# Patient Record
Sex: Female | Born: 1985 | Race: White | Hispanic: Yes | Marital: Single | Smoking: Current every day smoker
Health system: Southern US, Community
[De-identification: ages and names within clinical notes are randomized; demographics above are authoritative.]

## PROBLEM LIST (undated history)

## (undated) DIAGNOSIS — L0390 Cellulitis, unspecified: Secondary | ICD-10-CM

## (undated) DIAGNOSIS — E119 Type 2 diabetes mellitus without complications: Secondary | ICD-10-CM

---

## 2015-04-23 ENCOUNTER — Emergency Department (HOSPITAL_BASED_OUTPATIENT_CLINIC_OR_DEPARTMENT_OTHER): Payer: Self-pay

## 2015-04-23 ENCOUNTER — Encounter (HOSPITAL_BASED_OUTPATIENT_CLINIC_OR_DEPARTMENT_OTHER): Payer: Self-pay | Admitting: *Deleted

## 2015-04-23 ENCOUNTER — Inpatient Hospital Stay (HOSPITAL_BASED_OUTPATIENT_CLINIC_OR_DEPARTMENT_OTHER)
Admission: EM | Admit: 2015-04-23 | Discharge: 2015-04-27 | DRG: 580 | Disposition: A | Discharge status: Home / Self Care | Payer: Self-pay | Attending: Internal Medicine | Admitting: Internal Medicine

## 2015-04-23 DIAGNOSIS — W231XXA Caught, crushed, jammed, or pinched between stationary objects, initial encounter: Secondary | ICD-10-CM | POA: Diagnosis present

## 2015-04-23 DIAGNOSIS — IMO0001 Reserved for inherently not codable concepts without codable children: Secondary | ICD-10-CM | POA: Diagnosis present

## 2015-04-23 DIAGNOSIS — Z794 Long term (current) use of insulin: Secondary | ICD-10-CM

## 2015-04-23 DIAGNOSIS — Z833 Family history of diabetes mellitus: Secondary | ICD-10-CM

## 2015-04-23 DIAGNOSIS — E871 Hypo-osmolality and hyponatremia: Secondary | ICD-10-CM | POA: Diagnosis present

## 2015-04-23 DIAGNOSIS — E860 Dehydration: Secondary | ICD-10-CM | POA: Diagnosis present

## 2015-04-23 DIAGNOSIS — L02413 Cutaneous abscess of right upper limb: Secondary | ICD-10-CM | POA: Diagnosis present

## 2015-04-23 DIAGNOSIS — L03113 Cellulitis of right upper limb: Principal | ICD-10-CM | POA: Diagnosis present

## 2015-04-23 DIAGNOSIS — F17210 Nicotine dependence, cigarettes, uncomplicated: Secondary | ICD-10-CM | POA: Diagnosis present

## 2015-04-23 DIAGNOSIS — E1065 Type 1 diabetes mellitus with hyperglycemia: Secondary | ICD-10-CM | POA: Diagnosis present

## 2015-04-23 DIAGNOSIS — B3749 Other urogenital candidiasis: Secondary | ICD-10-CM | POA: Diagnosis present

## 2015-04-23 DIAGNOSIS — L0390 Cellulitis, unspecified: Secondary | ICD-10-CM | POA: Diagnosis present

## 2015-04-23 DIAGNOSIS — Z720 Tobacco use: Secondary | ICD-10-CM | POA: Diagnosis present

## 2015-04-23 HISTORY — DX: Cellulitis, unspecified: L03.90

## 2015-04-23 HISTORY — DX: Type 2 diabetes mellitus without complications: E11.9

## 2015-04-23 LAB — CBC WITH DIFFERENTIAL/PLATELET
Basophils Absolute: 0 10*3/uL (ref 0.0–0.1)
Basophils Relative: 0 %
EOS ABS: 0.1 10*3/uL (ref 0.0–0.7)
Eosinophils Relative: 1 %
HEMATOCRIT: 43.2 % (ref 36.0–46.0)
HEMOGLOBIN: 14.9 g/dL (ref 12.0–15.0)
LYMPHS ABS: 1.7 10*3/uL (ref 0.7–4.0)
LYMPHS PCT: 14 %
MCH: 31.5 pg (ref 26.0–34.0)
MCHC: 34.5 g/dL (ref 30.0–36.0)
MCV: 91.3 fL (ref 78.0–100.0)
MONOS PCT: 5 %
Monocytes Absolute: 0.6 10*3/uL (ref 0.1–1.0)
NEUTROS ABS: 9.5 10*3/uL — AB (ref 1.7–7.7)
NEUTROS PCT: 80 %
Platelets: 223 10*3/uL (ref 150–400)
RBC: 4.73 MIL/uL (ref 3.87–5.11)
RDW: 11.8 % (ref 11.5–15.5)
WBC: 11.8 10*3/uL — AB (ref 4.0–10.5)

## 2015-04-23 LAB — URINE MICROSCOPIC-ADD ON

## 2015-04-23 LAB — URINALYSIS, ROUTINE W REFLEX MICROSCOPIC
BILIRUBIN URINE: NEGATIVE
Glucose, UA: >1000 mg/dL — AB
HGB URINE DIPSTICK: NEGATIVE
Ketones, ur: NEGATIVE mg/dL
Nitrite: NEGATIVE
PH: 6 (ref 5.0–8.0)
Protein, ur: NEGATIVE mg/dL
SPECIFIC GRAVITY, URINE: 1.036 — AB (ref 1.005–1.030)

## 2015-04-23 LAB — BASIC METABOLIC PANEL
Anion gap: 10 (ref 5–15)
BUN: 11 mg/dL (ref 6–20)
CHLORIDE: 91 mmol/L — AB (ref 101–111)
CO2: 25 mmol/L (ref 22–32)
CREATININE: 0.79 mg/dL (ref 0.44–1.00)
Calcium: 9 mg/dL (ref 8.9–10.3)
GFR calc Af Amer: >60 mL/min (ref >60)
GFR calc non Af Amer: >60 mL/min (ref >60)
Glucose, Bld: 735 mg/dL (ref 65–99)
POTASSIUM: 4.7 mmol/L (ref 3.5–5.1)
SODIUM: 126 mmol/L — AB (ref 135–145)

## 2015-04-23 LAB — PREGNANCY, URINE: Preg Test, Ur: NEGATIVE

## 2015-04-23 LAB — CBG MONITORING, ED
GLUCOSE-CAPILLARY: 543 mg/dL — AB (ref 65–99)
Glucose-Capillary: 341 mg/dL — ABNORMAL HIGH (ref 65–99)
Glucose-Capillary: >600 mg/dL (ref 65–99)

## 2015-04-23 MED ORDER — INSULIN ASPART 100 UNIT/ML IV SOLN
5.0000 [IU] | Freq: Once | INTRAVENOUS | Status: AC
  Administered 2015-04-23: 5 [IU] via INTRAVENOUS
  Filled 2015-04-23: qty 1

## 2015-04-23 MED ORDER — HYDROMORPHONE HCL 1 MG/ML IJ SOLN
1.0000 mg | Freq: Once | INTRAMUSCULAR | Status: AC
  Administered 2015-04-23: 1 mg via INTRAVENOUS
  Filled 2015-04-23: qty 1

## 2015-04-23 MED ORDER — IBUPROFEN 400 MG PO TABS
600.0000 mg | ORAL_TABLET | Freq: Once | ORAL | Status: AC
Start: 2015-04-23 — End: 2015-04-23
  Administered 2015-04-23: 600 mg via ORAL
  Filled 2015-04-23: qty 1

## 2015-04-23 MED ORDER — SODIUM CHLORIDE 0.9 % IV BOLUS (SEPSIS)
1000.0000 mL | Freq: Once | INTRAVENOUS | Status: AC
  Administered 2015-04-23: 1000 mL via INTRAVENOUS

## 2015-04-23 MED ORDER — CLINDAMYCIN HCL 150 MG PO CAPS
300.0000 mg | ORAL_CAPSULE | Freq: Once | ORAL | Status: AC
  Administered 2015-04-23: 300 mg via ORAL
  Filled 2015-04-23: qty 2

## 2015-04-23 MED ORDER — METRONIDAZOLE 500 MG PO TABS
2000.0000 mg | ORAL_TABLET | Freq: Once | ORAL | Status: AC
Start: 2015-04-23 — End: 2015-04-23
  Administered 2015-04-23: 2000 mg via ORAL
  Filled 2015-04-23: qty 4

## 2015-04-23 MED ORDER — LIDOCAINE-EPINEPHRINE (PF) 2 %-1:200000 IJ SOLN
20.0000 mL | Freq: Once | INTRAMUSCULAR | Status: AC
  Administered 2015-04-23: 20 mL via INTRADERMAL
  Filled 2015-04-23: qty 20

## 2015-04-23 MED ORDER — SODIUM CHLORIDE 0.9 % IV BOLUS (SEPSIS): 2000.0000 mL | Freq: Once | INTRAVENOUS | Status: DC

## 2015-04-23 NOTE — ED Provider Notes (Signed)
CSN: [id number]     Arrival date & time 04/23/15  1942 History  By signing my name below, I, Tanda Rockers, attest that this documentation has been prepared under the direction and in the presence of Mirian Mo, MD. Electronically Signed: Tanda Rockers, ED Scribe. 04/23/2015. 8:25 PM.   Chief Complaint  Patient presents with  . Arm Pain   Patient is a 29 y.o. female presenting with arm pain. The history is provided by the patient. No language interpreter was used.  Arm Pain This is a new problem. The current episode started more than 2 days ago. The problem occurs rarely. The problem has been gradually worsening. Pertinent negatives include no chest pain, no abdominal pain, no headaches and no shortness of breath. Nothing aggravates the symptoms. Nothing relieves the symptoms. She has tried a cold compress (Ibuprofen. Bactrim.) for the symptoms. The treatment provided no relief.     HPI Comments: Toniette Devera is a 29 y.o. female who presents to the Emergency Department complaining of gradual onset, constant, right arm pain x 4 days. 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 She reports that her arm began to swell and turn red, causing her to think she has cellulitis. Pt took left over Bactrim from previous cellulitis and Ibuprofen without relief. Mom has also applied ice without relief. Pt complains of a fever last night of 101 and nausea. Denies chills, vomiting, or any other associated symptoms.    Past Medical History  Diagnosis Date  . Cellulitis   . Diabetes mellitus (HCC)    History reviewed. No pertinent past surgical history. Family History  Problem Relation Age of Onset  . Diabetes Mellitus II Father    Social History  Substance Use Topics  . Smoking status: Current Every Day Smoker    Types: Cigarettes  . Smokeless tobacco: Never Used  . Alcohol Use: No   OB History    No data available     Review of  Systems  Constitutional: Positive for fever. Negative for chills.  Respiratory: Negative for shortness of breath.   Cardiovascular: Negative for chest pain.  Gastrointestinal: Positive for nausea. Negative for vomiting and abdominal pain.  Musculoskeletal: Positive for joint swelling and arthralgias (Right arm pain).  Skin: Positive for color change. Negative for wound.  Neurological: Negative for headaches.  All other systems reviewed and are negative.   Allergies  Review of patient's allergies indicates no known allergies.  Home Medications   Prior to Admission medications   Medication Sig Start Date End Date Taking? Authorizing Provider  insulin aspart (NOVOLOG) 100 UNIT/ML injection Inject 8-10 Units into the skin 3 (three) times daily before meals. Sliding scale   Yes Historical Provider, MD  insulin glargine (LANTUS) 100 UNIT/ML injection Inject 30 Units into the skin at bedtime.   Yes Historical Provider, MD   Triage Vitals: BP 132/81 mmHg  Pulse 107  Temp(Src) 98.4 F (36.9 C) (Oral)  Resp 20  Ht  (1.575 m)  Wt 125 lb (56.7 kg)  BMI 22.86 kg/m2  SpO2 100%  LMP 04/11/2015 (Exact Date)   Physical Exam  Constitutional: She is oriented to person, place, and time. She appears well-developed and well-nourished.  HENT:  Head: Normocephalic and atraumatic.  Right Ear: External ear normal.  Left Ear: External ear normal.  Eyes: Conjunctivae and EOM are normal. Pupils are equal, round, and reactive to light.  Neck: Normal range of motion. Neck  supple.  Cardiovascular: Normal rate, regular rhythm, normal heart sounds and intact distal pulses.   Pulmonary/Chest: Effort normal and breath sounds normal.  Abdominal: Soft. Bowel sounds are normal. There is no tenderness.  Musculoskeletal: Normal range of motion.  Neurological: She is alert and oriented to person, place, and time.  Skin: Skin is warm and dry.  Cellulitis of R ventral forearm, pulse 2+ distally, limited ROM  in R wrist  Vitals reviewed.   ED Course  Procedures (including critical care time)  DIAGNOSTIC STUDIES: Oxygen Saturation is 100% on RA, normal by my interpretation.    COORDINATION OF CARE: 8:24 PM-Discussed treatment plan which includes DG R Forearm, DG R Wrist, CBC, BMP, CBG, urine pregnancy  with pt at bedside and pt agreed to plan.   Labs Review Labs Reviewed  CBC WITH DIFFERENTIAL/PLATELET - Abnormal; Notable for the following:    WBC 11.8 (*)    Neutro Abs 9.5 (*)    All other components within normal limits  BASIC METABOLIC PANEL - Abnormal; Notable for the following:    Sodium 126 (*)    Chloride 91 (*)    Glucose, Bld 735 (*)    All other components within normal limits  URINALYSIS, ROUTINE W REFLEX MICROSCOPIC (NOT AT [facility]) - Abnormal; Notable for the following:    APPearance CLOUDY (*)    Specific Gravity, Urine 1.036 (*)    Glucose, UA >1000 (*)    Leukocytes, UA MODERATE (*)    All other components within normal limits  URINE MICROSCOPIC-ADD ON - Abnormal; Notable for the following:    Squamous Epithelial / LPF 6-30 (*)    Bacteria, UA FEW (*)    All other components within normal limits  GLUCOSE, CAPILLARY - Abnormal; Notable for the following:    Glucose-Capillary 311 (*)    All other components within normal limits  SEDIMENTATION RATE - Abnormal; Notable for the following:    Sed Rate 36 (*)    All other components within normal limits  BASIC METABOLIC PANEL - Abnormal; Notable for the following:    Sodium 133 (*)    Glucose, Bld 363 (*)    Calcium 8.0 (*)    All other components within normal limits  CBC WITH DIFFERENTIAL/PLATELET - Abnormal; Notable for the following:    RBC 3.79 (*)    HCT 34.8 (*)    All other components within normal limits  GLUCOSE, CAPILLARY - Abnormal; Notable for the following:    Glucose-Capillary 244 (*)    All other components within normal limits  GLUCOSE, CAPILLARY - Abnormal; Notable for the following:     Glucose-Capillary 220 (*)    All other components within normal limits  GLUCOSE, CAPILLARY - Abnormal; Notable for the following:    Glucose-Capillary 248 (*)    All other components within normal limits  GLUCOSE, CAPILLARY - Abnormal; Notable for the following:    Glucose-Capillary 152 (*)    All other components within normal limits  GLUCOSE, CAPILLARY - Abnormal; Notable for the following:    Glucose-Capillary 121 (*)    All other components within normal limits  GLUCOSE, CAPILLARY - Abnormal; Notable for the following:    Glucose-Capillary 180 (*)    All other components within normal limits  GLUCOSE, CAPILLARY - Abnormal; Notable for the following:    Glucose-Capillary 243 (*)    All other components within normal limits  CBG MONITORING, ED - Abnormal; Notable for the following:    Glucose-Capillary >600 (*)  All other components within normal limits  CBG MONITORING, ED - Abnormal; Notable for the following:    Glucose-Capillary 543 (*)    All other components within normal limits  CBG MONITORING, ED - Abnormal; Notable for the following:    Glucose-Capillary 341 (*)    All other components within normal limits  PREGNANCY, URINE  HEMOGLOBIN A1C  CBG MONITORING, ED    Imaging Review Dg Forearm Right  04/23/2015  CLINICAL DATA:  29 year old with a crush injury to the right forearm and wrist 3 days ago, anterior pain with edema and erythema. Initial encounter. EXAM: RIGHT FOREARM - 2 VIEW COMPARISON:  None. FINDINGS: Volar soft tissue swelling. No evidence of acute fracture involving the radius or ulna. No intrinsic osseous abnormality. Visualized elbow joint intact. IMPRESSION: No osseous abnormality. Electronically Signed   By: Hulan Saas M.D.   On: 04/23/2015 21:08   Dg Wrist Complete Right  04/23/2015  CLINICAL DATA:  29 year old with a crush injury to the right forearm and wrist 3 days ago, anterior pain with edema and erythema. Initial encounter. EXAM: RIGHT  WRIST - COMPLETE 3+ VIEW COMPARISON:  None. FINDINGS: Volar and medial soft tissue swelling. No evidence of acute fracture or dislocation. Joint spaces well preserved. Well-preserved bone mineral density. No intrinsic osseous abnormalities. IMPRESSION: No osseous abnormality. Electronically Signed   By: Hulan Saas M.D.   On: 04/23/2015 21:07   I have personally reviewed and evaluated these images and lab results as part of my medical decision-making.   EKG Interpretation None        MDM   Final diagnoses:  None    29 y.o. female with pertinent PMH of DM, prior cellulitis presents with R wrist pain and rash as above.  [place] at bedside demonstrated underlying abscess extending below flexor tendon sheath with fluid along the tendon sheath itself.  Consulted Hand and medicine for admission.  Clinda given  I have reviewed all laboratory and imaging studies if ordered as above  No diagnosis found.        Mirian Mo, MD 04/24/15 [phone]

## 2015-04-23 NOTE — ED Notes (Signed)
Attempted to call report, RN unavailable, message left for call back.

## 2015-04-23 NOTE — ED Notes (Signed)
Patient reports a remote history of cutting, what appears to be track marks are also visible on patient's arms. Patient states she scraped her arm on a dumpster at the end of last week and that on Friday it became very painful and turned red. Redness to right forearm noted.

## 2015-04-23 NOTE — ED Notes (Signed)
Pt reports right arm was slammed in between a dumpster and a cabinet she was lifting on Friday- pt reports area began to swell and she thought it was cellulitis so she took some leftover Bactrim- no obvious wound noted. Wrist is swollen, red and warm to touch

## 2015-04-23 NOTE — Progress Notes (Signed)
CHART REVIEWED PATIENT BEING ADMITTED TO HOSPITALIST SERVICE PICTURES DO NOT LOOK BAD WOULD TREAT FOR CELLULITIS ICE/ELEVATE,SPLINT IV ABX WILL BE BY 11/22 IN PM TO SEE AND IF WORSENS MAY NEED FORMAL I/D KEEP NPO AFTER 7 AM PLEASE DO NOT CALL ME TONIGHT I AM AWARE OF PATIENT AND KNOW TO SEE PATIENT WHEN SHE ARRIVES AT [facility] AFTER 7 AM I CAN BE REACHED BY CELL [phone]

## 2015-04-23 NOTE — ED Notes (Signed)
IV attempted x1 unsucceesful. Attempted 2nd IV stick when patient began screaming and cursing to "take the damn thing out, I can take insulin at home, I don't want to be here, just take it out". IV catheter was removed. Blood collection supplies and labels remain at bedside. Will give patient a few minutes to calm down and will take her PO meds to her and offer a second nurse to attempt IV. Charge nurse notified.

## 2015-04-23 NOTE — Progress Notes (Signed)
[facility] Littie DeedsGentry  29 year old lady with history for diabetes, tobacco abuse, who presents with right arm pain after injury.   Pt reports right arm was slammed in between a dumpster and a cabinet when she was lifting on Friday. The area began to swell and becomes painful. She thought it was cellulitis,so she took some leftover Bactrim without significant help. No obvious wound noted per EDP. Wrist is swollen, red and warm to touch per EDP. WBC 11.8, temperature normal, tachycardia, electrolytes and renal function okay. X-ray of right wrist and right arm did not show bony fracture. Ortho, Dr. Anastasio Championrtamnn was consulted by EDP, will take her to OR in morning. Urinalysis showed yeast infection, 1 dose of Flagyl, 2 g was given. Patient's CBG was 735, which improved to 440 after treated with Novolog in ED. AG normal. No ketone in urine. I asked EDP to give pt 3L of NS. Accepted to Med-surg bed.   Lorretta HarpXilin Manolito Jurewicz, MD  [facility] Pager [phone]  If 7PM-7AM, please contact night-coverage [email or web address] Password [email or web address] 04/23/2015, 11:29 PM

## 2015-04-23 NOTE — ED Notes (Signed)
When I went in patient's room to obtain CBG, patient was sipping on the family members white chocolate starbucks double shot drink. Patient's family states it was just a few sips not much.

## 2015-04-23 NOTE — ED Notes (Signed)
Patient returned from XR. 

## 2015-04-24 ENCOUNTER — Encounter (HOSPITAL_COMMUNITY): Payer: Self-pay

## 2015-04-24 ENCOUNTER — Encounter (HOSPITAL_COMMUNITY)
Admission: EM | Disposition: A | Discharge status: Home / Self Care | Payer: Self-pay | Source: Home / Self Care | Attending: Internal Medicine

## 2015-04-24 ENCOUNTER — Inpatient Hospital Stay (HOSPITAL_COMMUNITY): Payer: Self-pay | Admitting: Anesthesiology

## 2015-04-24 ENCOUNTER — Inpatient Hospital Stay (HOSPITAL_COMMUNITY): Payer: Medicaid - Out of State | Admitting: Anesthesiology

## 2015-04-24 DIAGNOSIS — L03113 Cellulitis of right upper limb: Principal | ICD-10-CM

## 2015-04-24 DIAGNOSIS — E109 Type 1 diabetes mellitus without complications: Secondary | ICD-10-CM

## 2015-04-24 DIAGNOSIS — Z720 Tobacco use: Secondary | ICD-10-CM | POA: Diagnosis present

## 2015-04-24 DIAGNOSIS — IMO0001 Reserved for inherently not codable concepts without codable children: Secondary | ICD-10-CM | POA: Diagnosis present

## 2015-04-24 DIAGNOSIS — E1065 Type 1 diabetes mellitus with hyperglycemia: Secondary | ICD-10-CM

## 2015-04-24 HISTORY — PX: I & D EXTREMITY: SHX5045

## 2015-04-24 LAB — CBC WITH DIFFERENTIAL/PLATELET
BASOS ABS: 0 10*3/uL (ref 0.0–0.1)
BASOS PCT: 0 %
EOS ABS: 0.2 10*3/uL (ref 0.0–0.7)
EOS PCT: 2 %
HCT: 34.8 % — ABNORMAL LOW (ref 36.0–46.0)
Hemoglobin: 12.1 g/dL (ref 12.0–15.0)
Lymphocytes Relative: 26 %
Lymphs Abs: 2.4 10*3/uL (ref 0.7–4.0)
MCH: 31.9 pg (ref 26.0–34.0)
MCHC: 34.8 g/dL (ref 30.0–36.0)
MCV: 91.8 fL (ref 78.0–100.0)
MONO ABS: 0.4 10*3/uL (ref 0.1–1.0)
Monocytes Relative: 4 %
Neutro Abs: 6.3 10*3/uL (ref 1.7–7.7)
Neutrophils Relative %: 68 %
PLATELETS: 182 10*3/uL (ref 150–400)
RBC: 3.79 MIL/uL — ABNORMAL LOW (ref 3.87–5.11)
RDW: 12.3 % (ref 11.5–15.5)
WBC: 9.3 10*3/uL (ref 4.0–10.5)

## 2015-04-24 LAB — SURGICAL PCR SCREEN
MRSA, PCR: NEGATIVE
STAPHYLOCOCCUS AUREUS: NEGATIVE

## 2015-04-24 LAB — BASIC METABOLIC PANEL
ANION GAP: 6 (ref 5–15)
BUN: 7 mg/dL (ref 6–20)
CALCIUM: 8 mg/dL — AB (ref 8.9–10.3)
CO2: 24 mmol/L (ref 22–32)
Chloride: 103 mmol/L (ref 101–111)
Creatinine, Ser: 0.6 mg/dL (ref 0.44–1.00)
GFR calc Af Amer: >60 mL/min (ref >60)
GLUCOSE: 363 mg/dL — AB (ref 65–99)
Potassium: 4 mmol/L (ref 3.5–5.1)
Sodium: 133 mmol/L — ABNORMAL LOW (ref 135–145)

## 2015-04-24 LAB — SEDIMENTATION RATE: SED RATE: 36 mm/h — AB (ref 0–22)

## 2015-04-24 LAB — GLUCOSE, CAPILLARY
GLUCOSE-CAPILLARY: 311 mg/dL — AB (ref 65–99)
GLUCOSE-CAPILLARY: 244 mg/dL — AB (ref 65–99)
GLUCOSE-CAPILLARY: 121 mg/dL — AB (ref 65–99)
GLUCOSE-CAPILLARY: 180 mg/dL — AB (ref 65–99)
GLUCOSE-CAPILLARY: 273 mg/dL — AB (ref 65–99)
GLUCOSE-CAPILLARY: 95 mg/dL (ref 65–99)
GLUCOSE-CAPILLARY: 65 mg/dL (ref 65–99)
Glucose-Capillary: 220 mg/dL — ABNORMAL HIGH (ref 65–99)
Glucose-Capillary: 248 mg/dL — ABNORMAL HIGH (ref 65–99)
Glucose-Capillary: 152 mg/dL — ABNORMAL HIGH (ref 65–99)
Glucose-Capillary: 243 mg/dL — ABNORMAL HIGH (ref 65–99)
Glucose-Capillary: 117 mg/dL — ABNORMAL HIGH (ref 65–99)
Glucose-Capillary: 102 mg/dL — ABNORMAL HIGH (ref 65–99)
Glucose-Capillary: 106 mg/dL — ABNORMAL HIGH (ref 65–99)
Glucose-Capillary: 102 mg/dL — ABNORMAL HIGH (ref 65–99)

## 2015-04-24 SURGERY — IRRIGATION AND DEBRIDEMENT EXTREMITY
Anesthesia: General | Site: Arm Lower | Laterality: Right

## 2015-04-24 MED ORDER — HYDROCODONE-ACETAMINOPHEN 7.5-325 MG/15ML PO SOLN
10.0000 mL | Freq: Four times a day (QID) | ORAL | Status: DC | PRN
  Administered 2015-04-24 – 2015-04-27 (×3): 10 mL via ORAL
  Filled 2015-04-24 (×3): qty 15

## 2015-04-24 MED ORDER — ENSURE ENLIVE PO LIQD: 237.0000 mL | Freq: Two times a day (BID) | ORAL | Status: DC

## 2015-04-24 MED ORDER — SODIUM CHLORIDE 0.9 % IV SOLN
INTRAVENOUS | Status: DC
  Administered 2015-04-24: 5.6 [IU]/h via INTRAVENOUS
  Administered 2015-04-24: 2.8 [IU]/h via INTRAVENOUS
  Administered 2015-04-24: 1.8 [IU]/h via INTRAVENOUS
  Filled 2015-04-24: qty 2.5

## 2015-04-24 MED ORDER — INSULIN ASPART 100 UNIT/ML ~~LOC~~ SOLN
0.0000 [IU] | Freq: Every day | SUBCUTANEOUS | Status: DC
  Administered 2015-04-25: 4 [IU] via SUBCUTANEOUS

## 2015-04-24 MED ORDER — DEXTROSE 50 % IV SOLN
25.0000 mL | INTRAVENOUS | Status: DC | PRN
  Administered 2015-04-24: 14 mL via INTRAVENOUS

## 2015-04-24 MED ORDER — SODIUM CHLORIDE 0.9 % IV SOLN
INTRAVENOUS | Status: AC
  Administered 2015-04-24 (×2): via INTRAVENOUS

## 2015-04-24 MED ORDER — ONDANSETRON HCL 4 MG/2ML IJ SOLN
4.0000 mg | Freq: Four times a day (QID) | INTRAMUSCULAR | Status: DC | PRN
  Administered 2015-04-24: 4 mg via INTRAVENOUS
  Filled 2015-04-24: qty 2

## 2015-04-24 MED ORDER — PIPERACILLIN-TAZOBACTAM 3.375 G IVPB
3.3750 g | Freq: Three times a day (TID) | INTRAVENOUS | Status: DC
  Administered 2015-04-24 – 2015-04-26 (×6): 3.375 g via INTRAVENOUS
  Filled 2015-04-24 (×8): qty 50

## 2015-04-24 MED ORDER — ONDANSETRON HCL 4 MG PO TABS: 4.0000 mg | ORAL_TABLET | Freq: Four times a day (QID) | ORAL | Status: DC | PRN

## 2015-04-24 MED ORDER — HYDROMORPHONE HCL 1 MG/ML IJ SOLN
0.2500 mg | INTRAMUSCULAR | Status: DC | PRN
  Administered 2015-04-24 (×3): 0.5 mg via INTRAVENOUS

## 2015-04-24 MED ORDER — SODIUM CHLORIDE 0.9 % IR SOLN
Status: DC | PRN
  Administered 2015-04-24: 1000 mL

## 2015-04-24 MED ORDER — INSULIN REGULAR BOLUS VIA INFUSION
0.0000 [IU] | Freq: Three times a day (TID) | INTRAVENOUS | Status: DC
  Administered 2015-04-24: 1.2 [IU] via INTRAVENOUS
  Filled 2015-04-24: qty 10

## 2015-04-24 MED ORDER — HYDROMORPHONE HCL 1 MG/ML IJ SOLN
1.0000 mg | INTRAMUSCULAR | Status: AC | PRN
  Administered 2015-04-24 (×2): 1 mg via INTRAVENOUS
  Filled 2015-04-24 (×2): qty 1

## 2015-04-24 MED ORDER — OXYCODONE HCL 5 MG PO TABS: 5.0000 mg | ORAL_TABLET | ORAL | Status: DC | PRN

## 2015-04-24 MED ORDER — HEPARIN SODIUM (PORCINE) 5000 UNIT/ML IJ SOLN
5000.0000 [IU] | Freq: Three times a day (TID) | INTRAMUSCULAR | Status: DC
  Administered 2015-04-24 – 2015-04-27 (×6): 5000 [IU] via SUBCUTANEOUS
  Filled 2015-04-24 (×7): qty 1

## 2015-04-24 MED ORDER — INSULIN GLARGINE 100 UNIT/ML ~~LOC~~ SOLN
30.0000 [IU] | Freq: Every day | SUBCUTANEOUS | Status: DC
  Administered 2015-04-24: 30 [IU] via SUBCUTANEOUS
  Filled 2015-04-24: qty 0.3

## 2015-04-24 MED ORDER — INSULIN ASPART 100 UNIT/ML ~~LOC~~ SOLN
0.0000 [IU] | Freq: Three times a day (TID) | SUBCUTANEOUS | Status: DC
  Administered 2015-04-25: 15 [IU] via SUBCUTANEOUS
  Administered 2015-04-25: 11 [IU] via SUBCUTANEOUS
  Administered 2015-04-25: 8 [IU] via SUBCUTANEOUS
  Administered 2015-04-26: 15 [IU] via SUBCUTANEOUS
  Administered 2015-04-26: 8 [IU] via SUBCUTANEOUS
  Administered 2015-04-26: 15 [IU] via SUBCUTANEOUS
  Administered 2015-04-27: 5 [IU] via SUBCUTANEOUS
  Administered 2015-04-27: 15 [IU] via SUBCUTANEOUS

## 2015-04-24 MED ORDER — ACETAMINOPHEN 650 MG RE SUPP: 650.0000 mg | Freq: Four times a day (QID) | RECTAL | Status: DC | PRN

## 2015-04-24 MED ORDER — MIDAZOLAM HCL 2 MG/2ML IJ SOLN
INTRAMUSCULAR | Status: AC
  Filled 2015-04-24: qty 2

## 2015-04-24 MED ORDER — LIDOCAINE HCL (CARDIAC) 20 MG/ML IV SOLN
INTRAVENOUS | Status: DC | PRN
  Administered 2015-04-24: 60 mg via INTRAVENOUS

## 2015-04-24 MED ORDER — SODIUM CHLORIDE 0.9 % IV SOLN: INTRAVENOUS | Status: DC

## 2015-04-24 MED ORDER — MIDAZOLAM HCL 5 MG/5ML IJ SOLN
INTRAMUSCULAR | Status: DC | PRN
  Administered 2015-04-24 (×2): 2 mg via INTRAVENOUS

## 2015-04-24 MED ORDER — PROPOFOL 10 MG/ML IV BOLUS
INTRAVENOUS | Status: DC | PRN
  Administered 2015-04-24: 200 mg via INTRAVENOUS

## 2015-04-24 MED ORDER — INSULIN ASPART 100 UNIT/ML ~~LOC~~ SOLN: 0.0000 [IU] | Freq: Three times a day (TID) | SUBCUTANEOUS | Status: DC

## 2015-04-24 MED ORDER — HYDROMORPHONE HCL 1 MG/ML IJ SOLN
INTRAMUSCULAR | Status: AC
  Administered 2015-04-24: 0.5 mg via INTRAVENOUS
  Filled 2015-04-24: qty 1

## 2015-04-24 MED ORDER — HYDROMORPHONE HCL 1 MG/ML IJ SOLN
INTRAMUSCULAR | Status: AC
  Filled 2015-04-24: qty 1

## 2015-04-24 MED ORDER — SODIUM CHLORIDE 0.9 % IV SOLN
INTRAVENOUS | Status: DC
  Administered 2015-04-24: 03:00:00 via INTRAVENOUS

## 2015-04-24 MED ORDER — PROMETHAZINE HCL 25 MG/ML IJ SOLN
INTRAMUSCULAR | Status: AC
  Filled 2015-04-24: qty 1

## 2015-04-24 MED ORDER — PROPOFOL 10 MG/ML IV BOLUS
INTRAVENOUS | Status: AC
  Filled 2015-04-24: qty 20

## 2015-04-24 MED ORDER — PROMETHAZINE HCL 25 MG/ML IJ SOLN: 6.2500 mg | INTRAMUSCULAR | Status: DC | PRN

## 2015-04-24 MED ORDER — MORPHINE SULFATE (PF) 2 MG/ML IV SOLN
2.0000 mg | INTRAVENOUS | Status: DC | PRN
  Administered 2015-04-24 (×2): 2 mg via INTRAVENOUS
  Administered 2015-04-25: 1 mg via INTRAVENOUS
  Administered 2015-04-25: 2 mg via INTRAVENOUS
  Filled 2015-04-24 (×5): qty 1

## 2015-04-24 MED ORDER — VANCOMYCIN HCL IN DEXTROSE 750-5 MG/150ML-% IV SOLN
750.0000 mg | Freq: Two times a day (BID) | INTRAVENOUS | Status: DC
  Administered 2015-04-24 – 2015-04-26 (×5): 750 mg via INTRAVENOUS
  Filled 2015-04-24 (×7): qty 150

## 2015-04-24 MED ORDER — PIPERACILLIN-TAZOBACTAM 3.375 G IVPB 30 MIN
3.3750 g | Freq: Once | INTRAVENOUS | Status: AC
  Administered 2015-04-24: 3.375 g via INTRAVENOUS
  Filled 2015-04-24: qty 50

## 2015-04-24 MED ORDER — ONDANSETRON HCL 4 MG/2ML IJ SOLN
INTRAMUSCULAR | Status: DC | PRN
  Administered 2015-04-24: 4 mg via INTRAVENOUS

## 2015-04-24 MED ORDER — FENTANYL CITRATE (PF) 250 MCG/5ML IJ SOLN
INTRAMUSCULAR | Status: AC
  Filled 2015-04-24: qty 5

## 2015-04-24 MED ORDER — DEXTROSE 50 % IV SOLN
INTRAVENOUS | Status: AC
  Administered 2015-04-24: 14 mL via INTRAVENOUS
  Filled 2015-04-24: qty 50

## 2015-04-24 MED ORDER — FENTANYL CITRATE (PF) 100 MCG/2ML IJ SOLN
INTRAMUSCULAR | Status: DC | PRN
  Administered 2015-04-24: 100 ug via INTRAVENOUS
  Administered 2015-04-24: 50 ug via INTRAVENOUS
  Administered 2015-04-24: 100 ug via INTRAVENOUS

## 2015-04-24 MED ORDER — ACETAMINOPHEN 325 MG PO TABS: 650.0000 mg | ORAL_TABLET | Freq: Four times a day (QID) | ORAL | Status: DC | PRN

## 2015-04-24 MED ORDER — BUPIVACAINE HCL (PF) 0.25 % IJ SOLN
INTRAMUSCULAR | Status: AC
  Filled 2015-04-24: qty 30

## 2015-04-24 SURGICAL SUPPLY — 55 items
BANDAGE ELASTIC 3 VELCRO ST LF (GAUZE/BANDAGES/DRESSINGS) ×3 IMPLANT
BANDAGE ELASTIC 4 VELCRO ST LF (GAUZE/BANDAGES/DRESSINGS) ×3 IMPLANT
BNDG COHESIVE 1X5 TAN STRL LF (GAUZE/BANDAGES/DRESSINGS) IMPLANT
BNDG CONFORM 2 STRL LF (GAUZE/BANDAGES/DRESSINGS) IMPLANT
BNDG ESMARK 4X9 LF (GAUZE/BANDAGES/DRESSINGS) ×3 IMPLANT
BNDG GAUZE ELAST 4 BULKY (GAUZE/BANDAGES/DRESSINGS) ×3 IMPLANT
CORDS BIPOLAR (ELECTRODE) ×3 IMPLANT
COVER SURGICAL LIGHT HANDLE (MISCELLANEOUS) ×3 IMPLANT
CUFF TOURNIQUET SINGLE 18IN (TOURNIQUET CUFF) ×3 IMPLANT
CUFF TOURNIQUET SINGLE 24IN (TOURNIQUET CUFF) IMPLANT
DRAIN PENROSE 1/4X12 LTX STRL (WOUND CARE) IMPLANT
DRAPE SURG 17X23 STRL (DRAPES) ×3 IMPLANT
DRSG ADAPTIC 3X8 NADH LF (GAUZE/BANDAGES/DRESSINGS) ×3 IMPLANT
ELECT REM PT RETURN 9FT ADLT (ELECTROSURGICAL) ×3
ELECTRODE REM PT RTRN 9FT ADLT (ELECTROSURGICAL) ×1 IMPLANT
GAUZE IODOFORM PACK 1/2 7832 (GAUZE/BANDAGES/DRESSINGS) ×3 IMPLANT
GAUZE SPONGE 4X4 12PLY STRL (GAUZE/BANDAGES/DRESSINGS) ×3 IMPLANT
GAUZE XEROFORM 1X8 LF (GAUZE/BANDAGES/DRESSINGS) ×3 IMPLANT
GAUZE XEROFORM 5X9 LF (GAUZE/BANDAGES/DRESSINGS) IMPLANT
GLOVE BIOGEL PI IND STRL 8.5 (GLOVE) ×1 IMPLANT
GLOVE BIOGEL PI INDICATOR 8.5 (GLOVE) ×2
GLOVE SURG ORTHO 8.0 STRL STRW (GLOVE) ×3 IMPLANT
GOWN STRL REUS W/ TWL LRG LVL3 (GOWN DISPOSABLE) ×1 IMPLANT
GOWN STRL REUS W/ TWL XL LVL3 (GOWN DISPOSABLE) ×2 IMPLANT
GOWN STRL REUS W/TWL LRG LVL3 (GOWN DISPOSABLE) ×2
GOWN STRL REUS W/TWL XL LVL3 (GOWN DISPOSABLE) ×4
HANDPIECE INTERPULSE COAX TIP (DISPOSABLE)
KIT BASIN OR (CUSTOM PROCEDURE TRAY) ×3 IMPLANT
KIT ROOM TURNOVER OR (KITS) ×3 IMPLANT
MANIFOLD NEPTUNE II (INSTRUMENTS) ×3 IMPLANT
NEEDLE HYPO 25GX1X1/2 BEV (NEEDLE) ×3 IMPLANT
NS IRRIG 1000ML POUR BTL (IV SOLUTION) ×3 IMPLANT
PACK ORTHO EXTREMITY (CUSTOM PROCEDURE TRAY) ×3 IMPLANT
PAD ARMBOARD 7.5X6 YLW CONV (MISCELLANEOUS) ×6 IMPLANT
PAD CAST 4YDX4 CTTN HI CHSV (CAST SUPPLIES) ×1 IMPLANT
PADDING CAST COTTON 4X4 STRL (CAST SUPPLIES) ×2
SET HNDPC FAN SPRY TIP SCT (DISPOSABLE) IMPLANT
SOAP 2 % CHG 4 OZ (WOUND CARE) ×3 IMPLANT
SPLINT FIBERGLASS 3X12 (CAST SUPPLIES) ×3 IMPLANT
SPONGE LAP 18X18 X RAY DECT (DISPOSABLE) ×3 IMPLANT
SPONGE LAP 4X18 X RAY DECT (DISPOSABLE) ×3 IMPLANT
SUCTION FRAZIER TIP 10 FR DISP (SUCTIONS) ×3 IMPLANT
SUT ETHILON 4 0 PS 2 18 (SUTURE) IMPLANT
SUT ETHILON 5 0 P 3 18 (SUTURE)
SUT NYLON ETHILON 5-0 P-3 1X18 (SUTURE) IMPLANT
SUT PROLENE 3 0 PS 2 (SUTURE) ×3 IMPLANT
SYR CONTROL 10ML LL (SYRINGE) IMPLANT
TOWEL OR 17X24 6PK STRL BLUE (TOWEL DISPOSABLE) ×3 IMPLANT
TOWEL OR 17X26 10 PK STRL BLUE (TOWEL DISPOSABLE) ×3 IMPLANT
TUBE ANAEROBIC SPECIMEN COL (MISCELLANEOUS) IMPLANT
TUBE CONNECTING 12'X1/4 (SUCTIONS) ×1
TUBE CONNECTING 12X1/4 (SUCTIONS) ×2 IMPLANT
UNDERPAD 30X30 INCONTINENT (UNDERPADS AND DIAPERS) ×3 IMPLANT
WATER STERILE IRR 1000ML POUR (IV SOLUTION) ×3 IMPLANT
YANKAUER SUCT BULB TIP NO VENT (SUCTIONS) ×3 IMPLANT

## 2015-04-24 NOTE — Progress Notes (Signed)
Patient received pamphlet from [facility]. CM explained to patient that they may use the on site pharmacy to fill prescriptions given to them at discharge. Patient aware that the [facility] will not fill narcotics or pain medications prior to the patient being seen by one of their physicians.  Patient aware that they must be seen as a patient prior to the pharmacy filling the prescriptions a second time.  

## 2015-04-24 NOTE — ED Notes (Signed)
Receiving nurse, Caelin, notified patient is en route with [facility] at this time.

## 2015-04-24 NOTE — ED Notes (Signed)
Patient given diet ginger ale.

## 2015-04-24 NOTE — OR Nursing (Signed)
Upon arrival to PACU, cbg 65. Orders received from glucose stabilizer for 14 mls D50.  D50 given and insulin gtt stopped per stabilizer.  Dr. Krista BlueSinger informed of events.  Follow up CBG 106.

## 2015-04-24 NOTE — Progress Notes (Signed)
Patient Demographics:    Barbara Webb, is a 29 y.o. female, DOB - August 26, 1985, ZOX:[id number]  Admit date - 04/23/2015   Admitting Physician Lorretta Harp, MD  Outpatient Primary MD for the patient is No primary care provider on file.  LOS - 1   Chief Complaint  Patient presents with  . Arm Pain        Subjective:    Barbara Webb today has, No headache, No chest pain, No abdominal pain - No Nausea, No new weakness tingling or numbness, No Cough - SOB. Is having constant dull nonradiating right arm pain   Assessment  & Plan :     1. Right arm pain and swelling. After traumatic injury, no obvious laceration or signs of abscess formation, could have some muscle injury along with cellulitis. Continue empiric antibiotics. Hand surgery on board will defer management of this problem to hand surgery. She confirms she took tetanus shot 2 years ago.  2. DM type I. Poorly controlled. Change to glucose stabilizer for now.   3. Dehydration. IV fluids for hydration.   4. Smoking. Counseled to quit.   A does not pregnant.    Code Status : Full  Family Communication  : None present  Disposition Plan  : Remain inpatient for the next 2-3 days  Consults  : Hand surgery  Procedures  :   DVT Prophylaxis  :    Heparin   Lab Results  Component Value Date   PLT 182 04/24/2015    Inpatient Medications  Scheduled Meds: . feeding supplement (ENSURE ENLIVE)  237 mL Oral BID BM  . insulin regular  0-10 Units Intravenous TID WC  . piperacillin-tazobactam (ZOSYN)  IV  3.375 g Intravenous Q8H  . vancomycin  750 mg Intravenous Q12H   Continuous Infusions: . sodium chloride 100 mL/hr at 04/24/15 0750  . insulin (NOVOLIN-R) infusion 1.8 Units/hr (04/24/15 0754)   PRN Meds:.acetaminophen **OR**  acetaminophen, dextrose, ondansetron **OR** ondansetron (ZOFRAN) IV  Antibiotics  :     Anti-infectives    Start     Dose/Rate Route Frequency Ordered Stop   04/24/15 0800  piperacillin-tazobactam (ZOSYN) IVPB 3.375 g     3.375 g 12.5 mL/hr over 240 Minutes Intravenous Every 8 hours 04/24/15 0315     04/24/15 0400  vancomycin (VANCOCIN) IVPB 750 mg/150 ml premix     750 mg 150 mL/hr over 60 Minutes Intravenous Every 12 hours 04/24/15 0315     04/24/15 0330  piperacillin-tazobactam (ZOSYN) IVPB 3.375 g     3.375 g 100 mL/hr over 30 Minutes Intravenous  Once 04/24/15 0315 04/24/15 0413   04/23/15 2130  metroNIDAZOLE (FLAGYL) tablet 2,000 mg     2,000 mg Oral  Once 04/23/15 2129 04/23/15 2143   04/23/15 2030  clindamycin (CLEOCIN) capsule 300 mg     300 mg Oral  Once 04/23/15 2017 04/23/15 2110        Objective:   Filed Vitals:   04/23/15 2313 04/24/15 0022 04/24/15 0127 04/24/15 0527  BP: 111/78 109/70 103/59 96/62  Pulse: 98 100 87 89  Temp:   97.6 F (36.4 C) 97.5 F (36.4 C)  TempSrc:   Oral Oral  Resp: Height:   5'  2.4" (1.585 m)   Weight:   59.194 kg (130 lb 8 oz)   SpO2: 95% 98% 98% 95%    Wt Readings from Last 3 Encounters:  04/24/15 59.194 kg (130 lb 8 oz)     Intake/Output Summary (Last 24 hours) at 04/24/15 [id number] Last data filed at 04/24/15 0558  Gross per 24 hour  Intake 708.75 ml  Output    650 ml  Net  58.75 ml     Physical Exam  Awake Alert, Oriented X 3, No new F.N deficits, Normal affect [place].AT,PERRAL Supple Neck,No JVD, No cervical lymphadenopathy appriciated.  Symmetrical Chest wall movement, Good air movement bilaterally, CTAB RRR,No Gallops,Rubs or new Murmurs, No Parasternal Heave +ve B.Sounds, Abd Soft, No tenderness, No organomegaly appriciated, No rebound - guarding or rigidity. No Cyanosis, Clubbing or edema, No new Rash or bruise  Right arm and wrist are mildly swollen with mild erythema, tenderness to palpate. No obvious  fluctuation. Good radial pulse.    Data Review:   Micro Results No results found for this or any previous visit (from the past 240 hour(s)).  Radiology Reports Dg Forearm Right  04/23/2015  CLINICAL DATA:  29 year old with a crush injury to the right forearm and wrist 3 days ago, anterior pain with edema and erythema. Initial encounter. EXAM: RIGHT FOREARM - 2 VIEW COMPARISON:  None. FINDINGS: Volar soft tissue swelling. No evidence of acute fracture involving the radius or ulna. No intrinsic osseous abnormality. Visualized elbow joint intact. IMPRESSION: No osseous abnormality. Electronically Signed   By: Hulan Saashomas  Lawrence M.D.   On: 04/23/2015 21:08   Dg Wrist Complete Right  04/23/2015  CLINICAL DATA:  29 year old with a crush injury to the right forearm and wrist 3 days ago, anterior pain with edema and erythema. Initial encounter. EXAM: RIGHT WRIST - COMPLETE 3+ VIEW COMPARISON:  None. FINDINGS: Volar and medial soft tissue swelling. No evidence of acute fracture or dislocation. Joint spaces well preserved. Well-preserved bone mineral density. No intrinsic osseous abnormalities. IMPRESSION: No osseous abnormality. Electronically Signed   By: Hulan Saashomas  Lawrence M.D.   On: 04/23/2015 21:07     CBC  Recent Labs Lab 04/23/15 2100 04/24/15 0341  WBC 11.8* 9.3  HGB 14.9 12.1  HCT 43.2 34.8*  PLT 223 182  MCV 91.3 91.8  MCH 31.5 31.9  MCHC 34.5 34.8  RDW 11.8 12.3  LYMPHSABS 1.7 2.4  MONOABS 0.6 0.4  EOSABS 0.1 0.2  BASOSABS 0.0 0.0    Chemistries   Recent Labs Lab 04/23/15 2100 04/24/15 0341  NA 126* 133*  K 4.7 4.0  CL 91* 103  CO2 25 24  GLUCOSE 735* 363*  BUN 11 7  CREATININE 0.79 0.60  CALCIUM 9.0 8.0*   ------------------------------------------------------------------------------------------------------------------ estimated creatinine clearance is 85 mL/min (by C-G formula based on Cr of  0.6). ------------------------------------------------------------------------------------------------------------------ No results for input(s): HGBA1C in the last 72 hours. ------------------------------------------------------------------------------------------------------------------ No results for input(s): CHOL, HDL, LDLCALC, TRIG, CHOLHDL, LDLDIRECT in the last 72 hours. ------------------------------------------------------------------------------------------------------------------ No results for input(s): TSH, T4TOTAL, T3FREE, THYROIDAB in the last 72 hours.  Invalid input(s): FREET3 ------------------------------------------------------------------------------------------------------------------ No results for input(s): VITAMINB12, FOLATE, FERRITIN, TIBC, IRON, RETICCTPCT in the last 72 hours.  Coagulation profile No results for input(s): INR, PROTIME in the last 168 hours.  No results for input(s): DDIMER in the last 72 hours.  Cardiac Enzymes No results for input(s): CKMB, TROPONINI, MYOGLOBIN in the last 168 hours.  Invalid input(s): CK ------------------------------------------------------------------------------------------------------------------ Invalid input(s): POCBNP   Time Spent in  minutes  35   Susa Raring K M.D on 04/24/2015 at 9:22 AM  Between 7am to 7pm - Pager - [phone]  After 7pm go to [email or web address] - password [facility]  [facility] -  Office  [phone]

## 2015-04-24 NOTE — Progress Notes (Signed)
PLAN CONTINUE IV ABX AWAIT CULTURES I WILL CHANGE DRESSING ON 11/24 CONTINUE WITH INPATIENT CARE POSSIBLE D/C ON 11/24 IF WOUND LOOKS BETTER

## 2015-04-24 NOTE — ED Notes (Signed)
Friend who was previously riding with patient for transport has decided to go home. Provided room number and unit phone number.

## 2015-04-24 NOTE — Transfer of Care (Signed)
Immediate Anesthesia Transfer of Care Note  Patient: Barbara Webb  Procedure(s) Performed: Procedure(s): INCISION AND DRAINAGE RIGHT FOREARM (Right)  Patient Location: PACU  Anesthesia Type:General  Level of Consciousness: sedated and responds to stimulation  Airway & Oxygen Therapy: Patient Spontanous Breathing  Post-op Assessment: Report given to RN, Post -op Vital signs reviewed and stable, Patient moving all extremities and Patient moving all extremities X 4  Post vital signs: Reviewed and stable  Last Vitals:  Filed Vitals:   04/24/15 1636 04/24/15 2130  BP: 117/100 137/85  Pulse: 101 103  Temp: 37.1 C 36.9 C  Resp: 18 20    Complications: No apparent anesthesia complications

## 2015-04-24 NOTE — ED Notes (Signed)
[facility] arrived, report given

## 2015-04-24 NOTE — Care Management Note (Addendum)
Case Management Note  Patient Details  Name: Bernadene PersonDesiree Fife MRN: [id number] Date of Birth: Feb 07, 1986  Subjective/Objective:                  Date-04-24-15 Initial Assessment Spoke with patient at the bedside. Introduced self as Sports coachcase manager and explained role in discharge planning and how to be reached.  Verified patient lives at [address], [place] [address] with Ewing SchleinAunt Stephanie Lewis [phone]. Patient movd from [place] 10 days ago, she states that she plans on staying in [place] until at least the first of the yr. Verified patient anticipates to go home with family, at time of discharge.  Patient has no DME.  Expressed potential need for no other DME.  Patient confirmed  needing help with their medication. Patient has [place], she states that she is on Lantus and Novolog pens, and would prefer pens at discharge, but would accept vials as well. Patient is driven by Celine Ahrunt (relied on public transportation while living in [place]) to MD appointments.  Verified patient has no PCP. [facility] Patient provided brochure and maps.  Independent patient admitted from home with cellulitis/ abcess to underside of right wrist area. Patient has a history of seizures and diabetes. Patient currently on insulin gtt., surgery consulted.  Plan: CM will continue to follow for discharge planning and [facility].   Lawerance Sabalebbie Damiean Lukes RN BSN CM [phone]   Action/Plan:   Expected Discharge Date:                  Expected Discharge Plan:  Home/Self Care  In-House Referral:     Discharge planning Services  CM Consult, [facility]  Post Acute Care Choice:    Choice offered to:     DME Arranged:    DME Agency:     HH Arranged:    HH Agency:     Status of Service:  In process, will continue to follow  Medicare Important Message Given:    Date Medicare IM Given:    Medicare IM give by:    Date Additional Medicare IM Given:     Additional Medicare Important Message give by:     If discussed at Long Length of Stay Meetings, dates discussed:    Additional Comments:  Lawerance SabalDebbie Herminio Kniskern, RN 04/24/2015, 11:39 AM

## 2015-04-24 NOTE — Anesthesia Procedure Notes (Signed)
Procedure Name: LMA Insertion Date/Time: 04/24/2015 8:45 PM Performed by: Wray KearnsFOLEY, Chenita Ruda A Pre-anesthesia Checklist: Patient identified, Emergency Drugs available, Suction available, Patient being monitored and Timeout performed Patient Re-evaluated:Patient Re-evaluated prior to inductionOxygen Delivery Method: Circle system utilized Preoxygenation: Pre-oxygenation with 100% oxygen Intubation Type: IV induction Ventilation: Mask ventilation without difficulty LMA: LMA inserted LMA Size: 4.0 Tube type: Oral Number of attempts: 1 Placement Confirmation: positive ETCO2 and breath sounds checked- equal and bilateral Tube secured with: Tape Dental Injury: Teeth and Oropharynx as per pre-operative assessment

## 2015-04-24 NOTE — Op Note (Signed)
NAME:  Barbara Webb, Barbara Webb NO.:  [id number]  MEDICAL RECORD NO.:  [id number]  LOCATION:  [facility]                         FACILITY:  [facility]  PHYSICIAN:  Sharma Covert IV, M.D.DATE OF BIRTH:  December 06, 1985  DATE OF PROCEDURE:  04/24/2015 DATE OF DISCHARGE:                              OPERATIVE REPORT   PREOPERATIVE DIAGNOSIS:  Right forearm deep abscess.  POSTOPERATIVE DIAGNOSIS:  Right forearm deep abscess.  ATTENDING PHYSICIAN:  Sharma Covert, M.D., who was present and scrubbed for the entire procedure.  ASSISTANT:  None.  ANESTHESIA:  General via LMA.  SURGICAL PROCEDURE:  Decompression of right forearm deep abscess, distal third of the forearm.  DRAINS:  Half-inch packing gauze.  INTRAOPERATIVE CULTURES:  Aerobic and anaerobic cultures were taken.  SURGICAL INDICATIONS:  [name] is a right-hand-dominant female with a worsening abscess in the volar aspect of the forearm.  The patient is admitted to the Internal Medicine Service.  I was consulted for further management.  Risks, benefits, and alternatives were discussed in detail with the patient.  Signed informed consent was obtained.  Risks include but not limited to bleeding, infection, damage to nearby nerves, arteries, or tendons; loss of motion of the wrist and digits, incompletely relieved symptoms, and need for further surgical intervention.  DESCRIPTION OF PROCEDURE:  The patient was properly identified in the preoperative holding area, marked with a permanent marker in the right forearm to indicate the correct operative site.  The patient was then brought back to the operating room, placed supine on the Anesthesia room table.  General anesthetic was administered.  The patient tolerated this well.  A well-padded tourniquet was placed on right forearm, sealed with 1000 drape.  Right brachium was sealed with 1000 drape.  The right upper extremity was then prepped and draped in normal sterile  fashion.  A time- out was called.  The correct site was identified, and the procedure was begun.  A longitudinal incision made directly over the distal third of the forearm directly over the FCR sheath.  Dissection was carried down through the skin and subcutaneous tissue through the fascial layer, and deep the purulent material was then encountered.  Drainage of the abscess area was then carried out all the way down to the deep forearm compartments of the FPL and pronator quadratus.  The wound was then thoroughly irrigated, and debridement was then carried out of the devitalized tissue.  Thorough wound irrigation done.  After thorough wound irrigation and debridement, the wound was loosely reapproximated with Prolene sutures and closed with a packing gauze.  Adaptic dressing, sterile compressive bandage were then applied.  The patient tolerated the procedure well, was placed in a well-padded volar splint, extubated, and taken to recovery room in good condition.  POSTOPERATIVE PLAN:  The patient will be returned back to the Internal Medicine Service with IV antibiotics and pain control.  She will be seen on Thursday for taking out the packing and then the local wound care modalities.  She will be placed back in the splint.  Continue the IV antibiotics until the cultures come back.     Madelynn Done, M.D.     FWO/MEDQ  D:  04/24/2015  T:  04/24/2015  Job:  [id number]

## 2015-04-24 NOTE — Progress Notes (Signed)
ANTIBIOTIC CONSULT NOTE - INITIAL  Pharmacy Consult for Vancocin and Zosyn Indication: cellulitis  No Known Allergies  Patient Measurements: Height: 5' 2.4" (158.5 cm) Weight: 130 lb 8 oz (59.194 kg) IBW/kg (Calculated) : 51.02  Vital Signs: Temp: 97.6 F (36.4 C) (11/22 0127) Temp Source: Oral (11/22 0127) BP: 103/59 mmHg (11/22 0127) Pulse Rate: 87 (11/22 0127)  Labs:  Recent Labs  04/23/15 2100  WBC 11.8*  HGB 14.9  PLT 223  CREATININE 0.79   Estimated Creatinine Clearance: 85 mL/min (by C-G formula based on Cr of 0.79).   Medical History: Past Medical History  Diagnosis Date  . Cellulitis   . Diabetes mellitus (HCC)     Medications:  Prescriptions prior to admission  Medication Sig Dispense Refill Last Dose  . insulin aspart (NOVOLOG) 100 UNIT/ML injection Inject into the skin 3 (three) times daily before meals. Sliding scale     . insulin glargine (LANTUS) 100 UNIT/ML injection Inject 30 Units into the skin at bedtime.      Scheduled:  . insulin aspart  0-15 Units Subcutaneous TID WC  . insulin glargine  30 Units Subcutaneous QHS   Infusions:  . sodium chloride      Assessment: 29yo female sustained injury to RUE on Friday, area began to swell and pt self-medicated w/ leftover Bactrim, now to begin IV ABX for cellulitis.  Goal of Therapy:  Vancomycin trough level 10-15 mcg/ml  Plan:  [facility]; will continue with vancomycin 750mg  IV Q12H and Zosyn 3.375g IV Q8H and monitor CBC, Cx, levels prn.  Vernard GamblesVeronda Danyle Boening, PharmD, BCPS  04/24/2015,3:05 AM

## 2015-04-24 NOTE — Anesthesia Postprocedure Evaluation (Signed)
Anesthesia Post Note  Patient: Barbara Webb  Procedure(s) Performed: Procedure(s) (LRB): INCISION AND DRAINAGE RIGHT FOREARM (Right)  Patient location during evaluation: PACU Anesthesia Type: General Level of consciousness: sedated Pain management: pain level controlled Vital Signs Assessment: post-procedure vital signs reviewed and stable Respiratory status: spontaneous breathing and respiratory function stable Cardiovascular status: stable Anesthetic complications: no    Last Vitals:  Filed Vitals:   04/24/15 2145 04/24/15 2200  BP: 133/93 129/92  Pulse: 96 94  Temp:    Resp: 14 14    Last Pain:  Filed Vitals:   04/24/15 2203  PainSc: 10-Worst pain ever                 Arwa Yero DANIEL

## 2015-04-24 NOTE — Anesthesia Preprocedure Evaluation (Addendum)
Anesthesia Evaluation  Patient identified by MRN, date of birth, ID band Patient awake    Reviewed: Allergy & Precautions, H&P , NPO status , Patient's Chart, lab work & pertinent test results  Airway Mallampati: II  TM Distance: >3 FB Neck ROM: full    Dental no notable dental hx. (+) Teeth Intact   Pulmonary Current Smoker,    breath sounds clear to auscultation       Cardiovascular negative cardio ROS   Rhythm:regular Rate:Normal     Neuro/Psych negative neurological ROS  negative psych ROS   GI/Hepatic negative GI ROS, Neg liver ROS,   Endo/Other  diabetes  Renal/GU negative Renal ROS     Musculoskeletal   Abdominal   Peds  Hematology   Anesthesia Other Findings   Reproductive/Obstetrics negative OB ROS                            Anesthesia Physical Anesthesia Plan  ASA: II and emergent  Anesthesia Plan: General LMA   Post-op Pain Management:    Induction:   Airway Management Planned:   Additional Equipment:   Intra-op Plan:   Post-operative Plan:   Informed Consent: I have reviewed the patients History and Physical, chart, labs and discussed the procedure including the risks, benefits and alternatives for the proposed anesthesia with the patient or authorized representative who has indicated his/her understanding and acceptance.   Dental Advisory Given  Plan Discussed with: Anesthesiologist, CRNA and Surgeon  Anesthesia Plan Comments:         Anesthesia Quick Evaluation

## 2015-04-24 NOTE — H&P (Signed)
[facility] History and Physical  Barbara Webb ZOX:[id number] DOB: 01-29-1986 DOA: 04/23/2015  Referring physician: Patient was transferred from [facility] [place]. PCP: No primary care provider on file. patient just recently moved from [place]. Specialists: None.  Chief Complaint: Right forearm and wrist pain.  HPI: Barbara Webb is a 29 y.o. female with history of diabetes mellitus type 1 since to the ER because of worsening pain and swelling in the right forearm and wrist. Patient states that the dumpsters lid fell on her hand 3 days ago following which patient has been developing increasing pain and swelling in the forearm with subjective feeling of fever and chills. On exam patient has significant tenderness and unable to make a fist on her right side. X-rays do not show anything acute. On-call hand surgeon Dr. Alison Stalling was consulted and patient has been started on empiric antibiotics and admitted for cellulitis. Patient's blood sugars also found to be more 700 but not in DKA. Patient was given fluid boluses. Patient states her blood sugars have been running high since the injury on her right arm. She does not recall her hemoglobin A1c. Denies using any IV drugs.  Review of Systems: As presented in the history of presenting illness, rest negative.  Past Medical History  Diagnosis Date  . Cellulitis   . Diabetes mellitus (HCC)    History reviewed. No pertinent past surgical history. Social History:  reports that she has been smoking Cigarettes.  She has never used smokeless tobacco. She reports that she does not drink alcohol or use illicit drugs. Where does patient live home. Can patient participate in ADLs? Yes.  No Known Allergies  Family History:  Family History  Problem Relation Age of Onset  . Diabetes Mellitus II Father       Prior to Admission medications   Medication Sig Start Date End Date Taking? Authorizing Provider  insulin aspart (NOVOLOG) 100  UNIT/ML injection Inject into the skin 3 (three) times daily before meals. Sliding scale   Yes Historical Provider, MD  insulin glargine (LANTUS) 100 UNIT/ML injection Inject 30 Units into the skin at bedtime.   Yes Historical Provider, MD    Physical Exam: Filed Vitals:   04/23/15 2147 04/23/15 2313 04/24/15 0022 04/24/15 0127  BP: 117/67 111/78 109/70 103/59  Pulse: 98 98 100 87  Temp:    97.6 F (36.4 C)  TempSrc:    Oral  Resp: Height:    5' 2.4" (1.585 m)  Weight:    59.194 kg (130 lb 8 oz)  SpO2: 100% 95% 98% 98%     General:  Moderately built and nourished.  Eyes: Anicteric no pallor.  ENT: No discharge from the ears eyes nose and mouth.  Neck: No mass felt.  Cardiovascular: S1 and S2 heard.  Respiratory: No rhonchi or crepitations.  Abdomen: Soft nontender bowel sounds present.  Skin: Mild erythema in the right forearm and wrist area.  Musculoskeletal: Swelling and tenderness of the right forearm and wrist area unable to make fist of the right arm.  Psychiatric: Appears normal.  Neurologic: Alert awake oriented to time place and person. Moves all extremities.  Labs on Admission:  Basic Metabolic Panel:  Recent Labs Lab 04/23/15 2100  NA 126*  K 4.7  CL 91*  CO2 25  GLUCOSE 735*  BUN 11  CREATININE 0.79  CALCIUM 9.0   Liver Function Tests: No results for input(s): AST, ALT, ALKPHOS, BILITOT, PROT, ALBUMIN in the last  168 hours. No results for input(s): LIPASE, AMYLASE in the last 168 hours. No results for input(s): AMMONIA in the last 168 hours. CBC:  Recent Labs Lab 04/23/15 2100  WBC 11.8*  NEUTROABS 9.5*  HGB 14.9  HCT 43.2  MCV 91.3  PLT 223   Cardiac Enzymes: No results for input(s): CKTOTAL, CKMB, CKMBINDEX, TROPONINI in the last 168 hours.  BNP (last 3 results) No results for input(s): BNP in the last 8760 hours.  ProBNP (last 3 results) No results for input(s): PROBNP in the last 8760 hours.  CBG:  Recent  Labs Lab 04/23/15 2001 04/23/15 2227 04/23/15 2342 04/24/15 0124  GLUCAP >600* 543* 341* 311*    Radiological Exams on Admission: Dg Forearm Right  04/23/2015  CLINICAL DATA:  29 year old with a crush injury to the right forearm and wrist 3 days ago, anterior pain with edema and erythema. Initial encounter. EXAM: RIGHT FOREARM - 2 VIEW COMPARISON:  None. FINDINGS: Volar soft tissue swelling. No evidence of acute fracture involving the radius or ulna. No intrinsic osseous abnormality. Visualized elbow joint intact. IMPRESSION: No osseous abnormality. Electronically Signed   By: Hulan Saashomas  Lawrence M.D.   On: 04/23/2015 21:08   Dg Wrist Complete Right  04/23/2015  CLINICAL DATA:  29 year old with a crush injury to the right forearm and wrist 3 days ago, anterior pain with edema and erythema. Initial encounter. EXAM: RIGHT WRIST - COMPLETE 3+ VIEW COMPARISON:  None. FINDINGS: Volar and medial soft tissue swelling. No evidence of acute fracture or dislocation. Joint spaces well preserved. Well-preserved bone mineral density. No intrinsic osseous abnormalities. IMPRESSION: No osseous abnormality. Electronically Signed   By: Hulan Saashomas  Lawrence M.D.   On: 04/23/2015 21:07     Assessment/Plan Principal Problem:   Cellulitis of right forearm Active Problems:   Cellulitis   Diabetes mellitus type 1, uncontrolled, without complications (HCC)   Tobacco abuse   1. Cellulitis of the right forearm and wrist - Appreciate Dr. Orlan Leavensrtman, on-call hand surgeons notes. Blood cultures are obtained and patient has been placed on vancomycin and Zosyn. Patient will be kept nothing by mouth after breakfast in anticipation of possible procedure if required. Keep patient's head elevated. Pain relieving medications. 2. Diabetes mellitus type 1 uncontrolled not in DKA - patient has received 10 units of normal IV in the ER following which patient's blood sugar improved from 700-300. Patient has not taken her medications this  night for which I have ordered Lantus 30 units subcutaneous 1 dose now. Closely follow CBGs and recheck metabolic panel now to make sure patient is not getting into any anemia. Patient has been placed on moderate dose sliding-scale. Continue with aggressive hydration. 3. Hyponatremia probably from dehydration and hyperglycemia - hydrate and recheck metabolic panel. 4. Tobacco abuse - advised to quit smoking.    DVT ProphylaxisSCDs in anticipation of possible procedure. Code Status: Full code.  Family Communication: Discussed with patient.  Disposition Plan: Admit to inpatient.    Barbara Webb N. [facility] Pager [phone].  If 7PM-7AM, please contact night-coverage [email or web address] Password [email or web address] 04/24/2015, 3:04 AM

## 2015-04-24 NOTE — Consult Note (Signed)
PT SEEN/EXAMINED AT BEDSIDE WORSENING REDNESS AND SWELLING IN RIGHT FOREARM TTP OVER AREA VOLARLY CONCERN FOR DEEP SPACE INFECTION PLAN FOR INCISION AND DRAINAGE OF RIGHT FOREARM DISCUSSED WITH PATIENT

## 2015-04-24 NOTE — Progress Notes (Signed)
NURSING PROGRESS NOTE  Barbara Webb [id number] Admission Data: 04/24/2015 1:57 AM Attending Provider: Lorretta HarpXilin Niu, MD PCP:No primary care provider on file. Code Status: Full  Allergies:  Review of patient's allergies indicates no known allergies. Past Medical History:   has a past medical history of Cellulitis and Diabetes mellitus (HCC). Past Surgical History:   has no past surgical history on file. Social History:   reports that she has been smoking Cigarettes.  She has never used smokeless tobacco. She reports that she does not drink alcohol or use illicit drugs.  Barbara HemDesiree Sherlon HandingRodriguez is a 29 y.o. female patient admitted from ED:   Last Documented Vital Signs: Blood pressure 103/59, pulse 87, temperature 97.6 F (36.4 C), temperature source Oral, resp. rate 16, height 5' 2.4" (1.585 m), weight 59.194 kg (130 lb 8 oz), last menstrual period 04/11/2015, SpO2 98 %.  Cardiac Monitoring: None  IV Fluids:  IV in place, occlusive dsg intact without redness, IV cath antecubital right, condition patent and no redness normal saline.   Skin: Dry and intact.  Right arm redness and swelling.   Patient orientated to room. Information packet given to patient. Admission inpatient armband information verified with patient to include name and date of birth and placed on patient arm. Side rails up x 2, fall assessment and education completed with patient. Patient able to verbalize understanding of risk associated with falls and verbalized understanding to call for assistance before getting out of bed. Call light within reach. Patient able to voice and demonstrate understanding of unit orientation instructions.    Will continue to evaluate and treat per MD orders.  Sue LushKaelin Romesberg RN, BSN

## 2015-04-24 NOTE — Progress Notes (Signed)
Patient is refusing to be NPO. Pt. Has been educated about NPO status/possibiliy of surgery today. Pt. Noted to have hysterical emotions and states " I just want to eat and go home" "Leave me alone I just want to sleep". 1:1 given to patient, call bell within reach. Will continue to monitor.

## 2015-04-24 NOTE — Brief Op Note (Signed)
04/23/2015 - 04/24/2015  9:27 PM  PATIENT:  Bernadene Personesiree Betters  29 y.o. female  PRE-OPERATIVE DIAGNOSIS:  Right Forearm Infection  POST-OPERATIVE DIAGNOSIS:  Right Forearm Infection  PROCEDURE:  Procedure(s): INCISION AND DRAINAGE RIGHT FOREARM (Right)  SURGEON:  Surgeon(s) and Role:    * Bradly BienenstockFred Otho Michalik, MD - Primary  PHYSICIAN ASSISTANT:   ASSISTANTS: none   ANESTHESIA:   general  EBL:     BLOOD ADMINISTERED:none  DRAINS: none   LOCAL MEDICATIONS USED:  NONE  SPECIMEN:  No Specimen  DISPOSITION OF SPECIMEN:  N/A  COUNTS:  YES  TOURNIQUET:    DICTATION: .[id number]  PLAN OF CARE: Admit to inpatient   PATIENT DISPOSITION:  PACU - hemodynamically stable.   Delay start of Pharmacological VTE agent (>24hrs) due to surgical blood loss or risk of bleeding: not applicable

## 2015-04-24 NOTE — Progress Notes (Signed)
Nutrition Brief Note  Patient identified on the Malnutrition Screening Tool (MST) Report  Wt Readings from Last 15 Encounters:  04/24/15 130 lb 8 oz (59.194 kg)   Barbara Webb is a 29 y.o. female with history of diabetes mellitus type 1 since to the ER because of worsening pain and swelling in the right forearm and wrist. Patient states that the dumpsters lid fell on her hand 3 days ago following which patient has been developing increasing pain and swelling in the forearm with subjective feeling of fever and chills. On exam patient has significant tenderness and unable to make a fist on her right side. X-rays do not show anything acute. On-call hand surgeon Dr. Alison Stallingatman was consulted and patient has been started on empiric antibiotics and admitted for cellulitis. Patient's blood sugars also found to be more 700 but not in DKA. Patient was given fluid boluses. Patient states her blood sugars have been running high since the injury on her right arm. She does not recall her hemoglobin A1c. Denies using any IV drugs.  Pt sleeping soundly at time of visit. (Pt has been NPO after breakfast in anticipation for surgery. Per RN notes, pt very upset about NPO status).  Did not arouse for NFPE, however, pt did not exhibit any physical signs of malnutrition.   Body mass index is 23.56 kg/(m^2). Patient meets criteria for normal weight range based on current BMI.   Current diet order is Carb Modified, patient is consuming approximately n/a% of meals at this time. Labs and medications reviewed.  No nutrition interventions warranted at this time. If nutrition issues arise, please consult RD.   Sani Loiseau A. Mayford KnifeWilliams, RD, LDN, CDE Pager: [phone] After hours Pager: [phone]

## 2015-04-25 LAB — GLUCOSE, CAPILLARY
Glucose-Capillary: 100 mg/dL — ABNORMAL HIGH (ref 65–99)
Glucose-Capillary: 283 mg/dL — ABNORMAL HIGH (ref 65–99)
Glucose-Capillary: 307 mg/dL — ABNORMAL HIGH (ref 65–99)
Glucose-Capillary: 335 mg/dL — ABNORMAL HIGH (ref 65–99)
Glucose-Capillary: 380 mg/dL — ABNORMAL HIGH (ref 65–99)

## 2015-04-25 LAB — HEMOGLOBIN A1C
Hgb A1c MFr Bld: 11.9 % — ABNORMAL HIGH (ref 4.8–5.6)
Mean Plasma Glucose: 295 mg/dL

## 2015-04-25 LAB — RAPID URINE DRUG SCREEN, HOSP PERFORMED
AMPHETAMINES: NOT DETECTED
BENZODIAZEPINES: POSITIVE — AB
Barbiturates: NOT DETECTED
COCAINE: NOT DETECTED
OPIATES: POSITIVE — AB
TETRAHYDROCANNABINOL: POSITIVE — AB

## 2015-04-25 LAB — BASIC METABOLIC PANEL
ANION GAP: 10 (ref 5–15)
CALCIUM: 8.3 mg/dL — AB (ref 8.9–10.3)
CO2: 24 mmol/L (ref 22–32)
Chloride: 100 mmol/L — ABNORMAL LOW (ref 101–111)
Creatinine, Ser: 0.46 mg/dL (ref 0.44–1.00)
GFR calc Af Amer: >60 mL/min (ref >60)
GLUCOSE: 201 mg/dL — AB (ref 65–99)
Potassium: 4 mmol/L (ref 3.5–5.1)
Sodium: 134 mmol/L — ABNORMAL LOW (ref 135–145)

## 2015-04-25 LAB — CBC
HCT: 38.5 % (ref 36.0–46.0)
Hemoglobin: 13.1 g/dL (ref 12.0–15.0)
MCH: 31.8 pg (ref 26.0–34.0)
MCHC: 34 g/dL (ref 30.0–36.0)
MCV: 93.4 fL (ref 78.0–100.0)
PLATELETS: 171 10*3/uL (ref 150–400)
RBC: 4.12 MIL/uL (ref 3.87–5.11)
RDW: 12.5 % (ref 11.5–15.5)
WBC: 11 10*3/uL — AB (ref 4.0–10.5)

## 2015-04-25 LAB — MAGNESIUM: Magnesium: 1.5 mg/dL — ABNORMAL LOW (ref 1.7–2.4)

## 2015-04-25 MED ORDER — INSULIN GLARGINE 100 UNIT/ML ~~LOC~~ SOLN
20.0000 [IU] | Freq: Every day | SUBCUTANEOUS | Status: DC
  Administered 2015-04-25: 20 [IU] via SUBCUTANEOUS
  Filled 2015-04-25 (×2): qty 0.2

## 2015-04-25 MED ORDER — SODIUM CHLORIDE 0.9 % IV SOLN
INTRAVENOUS | Status: DC
  Administered 2015-04-25 – 2015-04-26 (×3): via INTRAVENOUS

## 2015-04-25 MED ORDER — MORPHINE SULFATE (PF) 2 MG/ML IV SOLN
1.0000 mg | INTRAVENOUS | Status: DC | PRN
Start: 2015-04-25 — End: 2015-04-27
  Administered 2015-04-25 – 2015-04-27 (×9): 2 mg via INTRAVENOUS
  Filled 2015-04-25 (×10): qty 1

## 2015-04-25 MED ORDER — NICOTINE 21 MG/24HR TD PT24
21.0000 mg | MEDICATED_PATCH | Freq: Every day | TRANSDERMAL | Status: DC
  Administered 2015-04-25 – 2015-04-26 (×2): 21 mg via TRANSDERMAL
  Filled 2015-04-25 (×3): qty 1

## 2015-04-25 NOTE — Progress Notes (Addendum)
Patient Demographics:    Barbara Webb, is a 29 y.o. female, DOB - [birth year], OZH:[id number]  Admit date - 04/23/2015   Admitting Physician Lorretta HarpXilin Niu, MD  Outpatient Primary MD for the patient is No primary care provider on file.  LOS - 2   Chief Complaint  Patient presents with  . Arm Pain        Subjective:    Barbara Webb today has, No headache, No chest pain, No abdominal pain - No Nausea, No new weakness tingling or numbness, No Cough - SOB. Still complaining of constant dull nonradiating right arm pain   Assessment  & Plan :      Right forearm deep abscess. -  After traumatic injury, hand surgery consult greatly appreciated, patient is status postDecompression of right forearm deep abscess, distal third of the forearm by Dr Melvyn Novasrtmann on 11/22 - Continue with broad-spectrum IV antibiotics pending wound cultures - And surgery to change wound dressing on 11/24, further management per hand surgery    DM type I. Poorly controlled.  - Glucose stabilizer was held overnight giving one episode of hypoglycemia as patient was nothing by mouth. - Resumed back on insulin sliding scale, home dose Lantus was decreased from 30-20 still having poor appetite, continue to monitor and adjust as needed - Glycohemoglobin A1c is 11.9   Dehydration. - IV fluids for hydration.    Smoking. - Nicotine patch    Code Status : Full  Family Communication  : None at bedside  Disposition Plan  : Remain inpatient for the next 2-3 days  Consults  : Hand surgery  Procedures  : patient is status postDecompression of right forearm deep abscess, distal third of the forearm by Dr Melvyn Novasrtmann on 11/22   DVT Prophylaxis  :    Heparin   Lab Results  Component Value Date   PLT 171 04/25/2015     Inpatient Medications  Scheduled Meds: . heparin subcutaneous  5,000 Units Subcutaneous 3 times per day  . insulin aspart  0-15 Units Subcutaneous TID WC  . insulin aspart  0-5 Units Subcutaneous QHS  . insulin glargine  20 Units Subcutaneous Daily  . piperacillin-tazobactam (ZOSYN)  IV  3.375 g Intravenous Q8H  . vancomycin  750 mg Intravenous Q12H   Continuous Infusions:   PRN Meds:.acetaminophen **OR** acetaminophen, dextrose, HYDROcodone-acetaminophen, morphine injection, ondansetron **OR** ondansetron (ZOFRAN) IV  Antibiotics  :     Anti-infectives    Start     Dose/Rate Route Frequency Ordered Stop   04/24/15 0800  piperacillin-tazobactam (ZOSYN) IVPB 3.375 g     3.375 g 12.5 mL/hr over 240 Minutes Intravenous Every 8 hours 04/24/15 0315     04/24/15 0400  vancomycin (VANCOCIN) IVPB 750 mg/150 ml premix     750 mg 150 mL/hr over 60 Minutes Intravenous Every 12 hours 04/24/15 0315     04/24/15 0330  piperacillin-tazobactam (ZOSYN) IVPB 3.375 g     3.375 g 100 mL/hr over 30 Minutes Intravenous  Once 04/24/15 0315 04/24/15 0413   04/23/15 2130  metroNIDAZOLE (FLAGYL) tablet 2,000 mg     2,000 mg Oral  Once 04/23/15 2129 04/23/15 2143   04/23/15 2030  clindamycin (CLEOCIN) capsule 300 mg     300 mg Oral  Once 04/23/15 2017 04/23/15 2110        Objective:   Filed Vitals:   04/24/15 2239 04/25/15 0005 04/25/15 0119 04/25/15 0432  BP:  129/79 119/77 125/76  Pulse: 94 97 97 91  Temp:  98.5 F (36.9 C) 98.4 F (36.9 C) 98.7 F (37.1 C)  TempSrc:  Oral Oral Oral  Resp:  Height:      Weight:      SpO2: 100% 100% 100% 100%    Wt Readings from Last 3 Encounters:  04/24/15 59.194 kg (130 lb 8 oz)     Intake/Output Summary (Last 24 hours) at 04/25/15 1214 Last data filed at 04/25/15 0852  Gross per 24 hour  Intake    828 ml  Output   1055 ml  Net   -227 ml     Physical Exam  Awake Alert, Oriented X 3, No new F.N deficits, Normal  affect [place].AT,PERRAL Supple Neck,No JVD, No cervical lymphadenopathy appriciated.  Symmetrical Chest wall movement, Good air movement bilaterally, CTAB RRR,No Gallops,Rubs or new Murmurs, No Parasternal Heave +ve B.Sounds, Abd Soft, No tenderness, No organomegaly appriciated, No rebound - guarding or rigidity. No Cyanosis, Clubbing or edema, No new Rash or bruise  Right arm is bandaged in forearm area till mid digit, has good capillary refills..    Data Review:   Micro Results Recent Results (from the past 240 hour(s))  Surgical pcr screen     Status: None   Collection Time: 04/24/15  8:04 PM  Result Value Ref Range Status   MRSA, PCR NEGATIVE NEGATIVE Final   Staphylococcus aureus NEGATIVE NEGATIVE Final    Comment:        The Xpert SA Assay (FDA approved for NASAL specimens in patients over 68 years of age), is one component of a comprehensive surveillance program.  Test performance has been validated by [facility] for patients greater than or equal to 17 year old. It is not intended to diagnose infection nor to guide or monitor treatment.   Anaerobic culture     Status: None (Preliminary result)   Collection Time: 04/24/15  8:56 PM  Result Value Ref Range Status   Specimen Description ABSCESS RIGHT FOREARM  Final   Special Requests PATIENT ON FOLLOWING VANCOMYCIN ZOSYN  Final   Gram Stain   Final    FEW WBC PRESENT,BOTH PMN AND MONONUCLEAR NO SQUAMOUS EPITHELIAL CELLS SEEN NO ORGANISMS SEEN Performed at [facility]    Culture PENDING  Incomplete   Report Status PENDING  Incomplete  Culture, routine-abscess     Status: None (Preliminary result)   Collection Time: 04/24/15  8:56 PM  Result Value Ref Range Status   Specimen Description ABSCESS RIGHT FOREARM  Final   Special Requests PATIENT ON FOLLOWING VANCOMYCIN ZOSYN  Final   Gram Stain   Final    FEW WBC PRESENT,BOTH PMN AND MONONUCLEAR NO SQUAMOUS EPITHELIAL CELLS SEEN NO ORGANISMS SEEN Performed at  [facility]    Culture NO GROWTH Performed at [facility]   Final   Report Status PENDING  Incomplete    Radiology Reports Dg Forearm Right  04/23/2015  CLINICAL DATA:  29 year old with a crush injury to the right forearm and wrist 3 days ago, anterior pain with edema and erythema. Initial encounter. EXAM: RIGHT FOREARM - 2 VIEW COMPARISON:  None. FINDINGS: Volar soft tissue swelling. No evidence of acute fracture involving the radius or ulna. No intrinsic osseous abnormality. Visualized elbow  joint intact. IMPRESSION: No osseous abnormality. Electronically Signed   By: Hulan Saas M.D.   On: 04/23/2015 21:08   Dg Wrist Complete Right  04/23/2015  CLINICAL DATA:  29 year old with a crush injury to the right forearm and wrist 3 days ago, anterior pain with edema and erythema. Initial encounter. EXAM: RIGHT WRIST - COMPLETE 3+ VIEW COMPARISON:  None. FINDINGS: Volar and medial soft tissue swelling. No evidence of acute fracture or dislocation. Joint spaces well preserved. Well-preserved bone mineral density. No intrinsic osseous abnormalities. IMPRESSION: No osseous abnormality. Electronically Signed   By: Hulan Saas M.D.   On: 04/23/2015 21:07     CBC  Recent Labs Lab 04/23/15 2100 04/24/15 0341 04/25/15 0444  WBC 11.8* 9.3 11.0*  HGB 14.9 12.1 13.1  HCT 43.2 34.8* 38.5  PLT 223 182 171  MCV 91.3 91.8 93.4  MCH 31.5 31.9 31.8  MCHC 34.5 34.8 34.0  RDW 11.8 12.3 12.5  LYMPHSABS 1.7 2.4  --   MONOABS 0.6 0.4  --   EOSABS 0.1 0.2  --   BASOSABS 0.0 0.0  --     Chemistries   Recent Labs Lab 04/23/15 2100 04/24/15 0341 04/25/15 0444  NA 126* 133* 134*  K 4.7 4.0 4.0  CL 91* 103 100*  CO2 GLUCOSE 735* 363* 201*  BUN 11 7 <5*  CREATININE 0.79 0.60 0.46  CALCIUM 9.0 8.0* 8.3*  MG  --   --  1.5*   ------------------------------------------------------------------------------------------------------------------ estimated creatinine  clearance is 85 mL/min (by C-G formula based on Cr of 0.46). ------------------------------------------------------------------------------------------------------------------  Recent Labs  04/24/15 0341  HGBA1C 11.9*   ------------------------------------------------------------------------------------------------------------------ No results for input(s): CHOL, HDL, LDLCALC, TRIG, CHOLHDL, LDLDIRECT in the last 72 hours. ------------------------------------------------------------------------------------------------------------------ No results for input(s): TSH, T4TOTAL, T3FREE, THYROIDAB in the last 72 hours.  Invalid input(s): FREET3 ------------------------------------------------------------------------------------------------------------------ No results for input(s): VITAMINB12, FOLATE, FERRITIN, TIBC, IRON, RETICCTPCT in the last 72 hours.  Coagulation profile No results for input(s): INR, PROTIME in the last 168 hours.  No results for input(s): DDIMER in the last 72 hours.  Cardiac Enzymes No results for input(s): CKMB, TROPONINI, MYOGLOBIN in the last 168 hours.  Invalid input(s): CK ------------------------------------------------------------------------------------------------------------------ Invalid input(s): POCBNP   Time Spent in minutes  30 minutes   Chelise Hanger M.D on 04/25/2015 at 12:14 PM  Between 7am to 7pm - Pager - [phone]  After 7pm go to [email or web address] - password [facility]  [facility] -  Office  [phone]

## 2015-04-25 NOTE — Progress Notes (Addendum)
Inpatient Diabetes Program Recommendations  AACE/ADA: New Consensus Statement on Inpatient Glycemic Control (2015)  Target Ranges:  Prepandial:   less than 140 mg/dL      Peak postprandial:   less than 180 mg/dL (1-2 hours)      Critically ill patients:  140 - 180 mg/dL   Review of Glycemic Control  Diabetes history: DM 2 Outpatient Diabetes medications: Lantus 30 units QHS, Novlog 8-10 units TID Current orders for Inpatient glycemic control: Novolog Moderate + HS  Inpatient Diabetes Program Recommendations: Insulin - Basal: Patient has a history of DM type 1. Patient takes Lantus 30 units at home. Please consider ordering most of the patients basal insulin while inpatient. Glucose is already almost 300.  Paged Dr. Randol KernElgergawy at [phone].  Thanks,  Christena DeemShannon Steve Gregg RN, MSN, [facility] Inpatient Diabetes Coordinator Team Pager [phone] (8a-5p)

## 2015-04-26 LAB — CBC
HCT: 42.6 % (ref 36.0–46.0)
Hemoglobin: 14.1 g/dL (ref 12.0–15.0)
MCH: 31 pg (ref 26.0–34.0)
MCHC: 33.1 g/dL (ref 30.0–36.0)
MCV: 93.6 fL (ref 78.0–100.0)
PLATELETS: 225 10*3/uL (ref 150–400)
RBC: 4.55 MIL/uL (ref 3.87–5.11)
RDW: 12.5 % (ref 11.5–15.5)
WBC: 6.7 10*3/uL (ref 4.0–10.5)

## 2015-04-26 LAB — BASIC METABOLIC PANEL
Anion gap: 8 (ref 5–15)
BUN: 7 mg/dL (ref 6–20)
CO2: 28 mmol/L (ref 22–32)
CREATININE: 0.5 mg/dL (ref 0.44–1.00)
Calcium: 9 mg/dL (ref 8.9–10.3)
Chloride: 102 mmol/L (ref 101–111)
Glucose, Bld: 203 mg/dL — ABNORMAL HIGH (ref 65–99)
Potassium: 3.9 mmol/L (ref 3.5–5.1)
SODIUM: 138 mmol/L (ref 135–145)

## 2015-04-26 LAB — GLUCOSE, CAPILLARY
GLUCOSE-CAPILLARY: 364 mg/dL — AB (ref 65–99)
GLUCOSE-CAPILLARY: 171 mg/dL — AB (ref 65–99)
Glucose-Capillary: 297 mg/dL — ABNORMAL HIGH (ref 65–99)
Glucose-Capillary: 391 mg/dL — ABNORMAL HIGH (ref 65–99)
Glucose-Capillary: 376 mg/dL — ABNORMAL HIGH (ref 65–99)

## 2015-04-26 LAB — VANCOMYCIN, TROUGH: Vancomycin Tr: 7 ug/mL — ABNORMAL LOW (ref 10.0–20.0)

## 2015-04-26 MED ORDER — INSULIN GLARGINE 100 UNIT/ML ~~LOC~~ SOLN
10.0000 [IU] | Freq: Once | SUBCUTANEOUS | Status: AC
  Administered 2015-04-26: 10 [IU] via SUBCUTANEOUS
  Filled 2015-04-26: qty 0.1

## 2015-04-26 MED ORDER — INSULIN GLARGINE 100 UNIT/ML ~~LOC~~ SOLN
35.0000 [IU] | Freq: Every day | SUBCUTANEOUS | Status: DC
  Administered 2015-04-26: 35 [IU] via SUBCUTANEOUS
  Filled 2015-04-26 (×2): qty 0.35

## 2015-04-26 MED ORDER — VANCOMYCIN HCL 500 MG IV SOLR
500.0000 mg | Freq: Three times a day (TID) | INTRAVENOUS | Status: DC
  Administered 2015-04-27 (×2): 500 mg via INTRAVENOUS
  Filled 2015-04-26 (×5): qty 500

## 2015-04-26 NOTE — Discharge Instructions (Signed)
KEEP BANDAGE CLEAN AND DRY °CALL OFFICE FOR F/U APPT [phone] IN 7 DAYS °KEEP HAND ELEVATED ABOVE HEART °OK TO APPLY ICE TO OPERATIVE AREA °CONTACT OFFICE IF ANY WORSENING PAIN OR CONCERNS. °

## 2015-04-26 NOTE — Progress Notes (Addendum)
ANTIBIOTIC CONSULT NOTE - Follow Up  Pharmacy Consult for Vancocin and Zosyn Indication: cellulitis  No Known Allergies  Patient Measurements: Height: 5' 2.4" (158.5 cm) Weight: 130 lb 8 oz (59.194 kg) IBW/kg (Calculated) : 51.02  Vital Signs: Temp: 98.3 F (36.8 C) (11/24 1524) Temp Source: Oral (11/24 1524) BP: 95/38 mmHg (11/24 1524) Pulse Rate: 87 (11/24 1524)  Labs:  Recent Labs  04/24/15 0341 04/25/15 0444 04/26/15 0229  WBC 9.3 11.0* 6.7  HGB 12.1 13.1 14.1  PLT 182 171 225  CREATININE 0.60 0.46 0.50   Estimated Creatinine Clearance: 85 mL/min (by C-G formula based on Cr of 0.5).   Medical History: Past Medical History  Diagnosis Date  . Cellulitis   . Diabetes mellitus (HCC)     Medications:  Prescriptions prior to admission  Medication Sig Dispense Refill Last Dose  . insulin aspart (NOVOLOG) 100 UNIT/ML injection Inject 8-10 Units into the skin 3 (three) times daily before meals. Sliding scale   04/23/2015 at Unknown time  . insulin glargine (LANTUS) 100 UNIT/ML injection Inject 30 Units into the skin at bedtime.   04/23/2015 at Unknown time   Scheduled:  . heparin subcutaneous  5,000 Units Subcutaneous 3 times per day  . insulin aspart  0-15 Units Subcutaneous TID WC  . insulin aspart  0-5 Units Subcutaneous QHS  . insulin glargine  35 Units Subcutaneous Daily  . nicotine  21 mg Transdermal Daily  . vancomycin  750 mg Intravenous Q12H   Infusions:  . sodium chloride 75 mL/hr at 04/25/15 1730   Assessment: 29yo female sustained injury to RUE on Friday, area began to swell and pt self-medicated w/ leftover Bactrim, now to begin IV ABX for cellulitis.  She has been tolerating her IV Vancomycin without noted complications.  Trough level was drawn and is back below desired goal range at 7 mcg/ml which indicates a better than expected clearance.  Goal of Therapy:  Vancomycin trough level 10-15 mcg/ml  Plan:  Change Vancomycin doe to 500mg  IV and  interval to every 8 hours. and Zosyn 3.375g IV Q8H and monitor CBC, Cx, levels prn.  Nadara MustardNita Carmina Walle, PharmD., MS Clinical Pharmacist Pager:  [phone] Thank you for allowing pharmacy to be part of this patients care team. 04/26/2015,4:52 PM

## 2015-04-26 NOTE — Progress Notes (Signed)
Patient Demographics:    Barbara Webb, is a 29 y.o. female, DOB - 14-May-1986, ZOX:[id number]  Admit date - 04/23/2015   Admitting Physician Lorretta HarpXilin Niu, MD  Outpatient Primary MD for the patient is No primary care provider on file.  LOS - 3   Chief Complaint  Patient presents with  . Arm Pain        Subjective:    Barbara PersonDesiree Schadler today has, No headache, No chest pain, No abdominal pain - No Nausea, No new weakness tingling or numbness, No Cough - SOB. Pain is better controlled.   Assessment  & Plan :      Right forearm deep abscess. -  After traumatic injury, hand surgery consult greatly appreciated, patient is status post Decompression of right forearm deep abscess, distal third of the forearm by Dr Melvyn Novasrtmann on 11/22 - Abscess culture growing Staphylococcus aureus, stopped IV vancomycin, with continue with IV vancomycin pending antibiotic sensitivities. - Right forearm looks better as per hand surgery on physical exam today, and is to continue with splint at all times, and follow with hand surgery as an outpatient.    DM type I. Poorly controlled.  - Initially on Glucose stabilizer was held  giving one episode of hypoglycemia as patient was nothing by mouth. - Resumed back on insulin sliding scale, dose Lantus is 30, initially on 20 units subcutaneous daily, CBGs poorly controlled, was increased to 35 units today. - Glycohemoglobin A1c is 11.9   Dehydration. - IV fluids for hydration.    Smoking. - Nicotine patch    Code Status : Full  Family Communication  : None at bedside  Disposition Plan  : Hopefully can be discharged in 24 hours  Consults  : Hand surgery  Procedures  : patient is status postDecompression of right forearm deep abscess, distal third of the forearm by Dr  Melvyn Novasrtmann on 11/22   DVT Prophylaxis  :    Heparin   Lab Results  Component Value Date   PLT 225 04/26/2015    Inpatient Medications  Scheduled Meds: . heparin subcutaneous  5,000 Units Subcutaneous 3 times per day  . insulin aspart  0-15 Units Subcutaneous TID WC  . insulin aspart  0-5 Units Subcutaneous QHS  . insulin glargine  35 Units Subcutaneous Daily  . nicotine  21 mg Transdermal Daily  . vancomycin  750 mg Intravenous Q12H   Continuous Infusions: . sodium chloride 75 mL/hr at 04/25/15 1730   PRN Meds:.acetaminophen **OR** acetaminophen, dextrose, HYDROcodone-acetaminophen, morphine injection, ondansetron **OR** ondansetron (ZOFRAN) IV  Antibiotics  :     Anti-infectives    Start     Dose/Rate Route Frequency Ordered Stop   04/24/15 0800  piperacillin-tazobactam (ZOSYN) IVPB 3.375 g  Status:  Discontinued     3.375 g 12.5 mL/hr over 240 Minutes Intravenous Every 8 hours 04/24/15 0315 04/26/15 0738   04/24/15 0400  vancomycin (VANCOCIN) IVPB 750 mg/150 ml premix     750 mg 150 mL/hr over 60 Minutes Intravenous Every 12 hours 04/24/15 0315     04/24/15 0330  piperacillin-tazobactam (ZOSYN) IVPB 3.375 g     3.375 g 100 mL/hr over 30 Minutes Intravenous  Once 04/24/15 0315 04/24/15 0413   04/23/15 2130  metroNIDAZOLE (FLAGYL) tablet  2,000 mg     2,000 mg Oral  Once 04/23/15 2129 04/23/15 2143   04/23/15 2030  clindamycin (CLEOCIN) capsule 300 mg     300 mg Oral  Once 04/23/15 2017 04/23/15 2110        Objective:   Filed Vitals:   04/25/15 0432 04/25/15 1408 04/25/15 2109 04/26/15 0503  BP: 125/76 136/91 118/83 107/67  Pulse: 91 133 94 85  Temp: 98.7 F (37.1 C) 98.2 F (36.8 C) 99.1 F (37.3 C) 98.4 F (36.9 C)  TempSrc: Oral Oral Oral Oral  Resp: Height:      Weight:      SpO2: 100% 98% 100% 97%    Wt Readings from Last 3 Encounters:  04/24/15 59.194 kg (130 lb 8 oz)     Intake/Output Summary (Last 24 hours) at 04/26/15 1451 Last  data filed at 04/26/15 1100  Gross per 24 hour  Intake 1656.25 ml  Output   1750 ml  Net -93.75 ml     Physical Exam  Awake Alert, Oriented X 3, No new F.N deficits, Normal affect [place].AT,PERRAL Supple Neck,No JVD, No cervical lymphadenopathy appriciated.  Symmetrical Chest wall movement, Good air movement bilaterally, CTAB RRR,No Gallops,Rubs or new Murmurs, No Parasternal Heave +ve B.Sounds, Abd Soft, No tenderness, No organomegaly appriciated, No rebound - guarding or rigidity. No Cyanosis, Clubbing or edema, No new Rash or bruise  Right arm is bandaged in forearm area till mid digit, has good capillary refills..    Data Review:   Micro Results Recent Results (from the past 240 hour(s))  Surgical pcr screen     Status: None   Collection Time: 04/24/15  8:04 PM  Result Value Ref Range Status   MRSA, PCR NEGATIVE NEGATIVE Final   Staphylococcus aureus NEGATIVE NEGATIVE Final    Comment:        The Xpert SA Assay (FDA approved for NASAL specimens in patients over 91 years of age), is one component of a comprehensive surveillance program.  Test performance has been validated by [facility] for patients greater than or equal to 32 year old. It is not intended to diagnose infection nor to guide or monitor treatment.   Anaerobic culture     Status: None (Preliminary result)   Collection Time: 04/24/15  8:56 PM  Result Value Ref Range Status   Specimen Description ABSCESS RIGHT FOREARM  Final   Special Requests PATIENT ON FOLLOWING VANCOMYCIN ZOSYN  Final   Gram Stain   Final    FEW WBC PRESENT,BOTH PMN AND MONONUCLEAR NO SQUAMOUS EPITHELIAL CELLS SEEN NO ORGANISMS SEEN Performed at [facility]    Culture   Final    NO ANAEROBES ISOLATED; CULTURE IN PROGRESS FOR 5 DAYS Performed at [facility]    Report Status PENDING  Incomplete  Culture, routine-abscess     Status: None (Preliminary result)   Collection Time: 04/24/15  8:56 PM  Result Value Ref  Range Status   Specimen Description ABSCESS RIGHT FOREARM  Final   Special Requests PATIENT ON FOLLOWING VANCOMYCIN ZOSYN  Final   Gram Stain   Final    FEW WBC PRESENT,BOTH PMN AND MONONUCLEAR NO SQUAMOUS EPITHELIAL CELLS SEEN NO ORGANISMS SEEN Performed at [facility]    Culture   Final    MODERATE STAPHYLOCOCCUS AUREUS Note: RIFAMPIN AND GENTAMICIN SHOULD NOT BE USED AS SINGLE DRUGS FOR TREATMENT OF STAPH INFECTIONS. Performed at [facility]    Report  Status PENDING  Incomplete    Radiology Reports Dg Forearm Right  04/23/2015  CLINICAL DATA:  29 year old with a crush injury to the right forearm and wrist 3 days ago, anterior pain with edema and erythema. Initial encounter. EXAM: RIGHT FOREARM - 2 VIEW COMPARISON:  None. FINDINGS: Volar soft tissue swelling. No evidence of acute fracture involving the radius or ulna. No intrinsic osseous abnormality. Visualized elbow joint intact. IMPRESSION: No osseous abnormality. Electronically Signed   By: Hulan Saas M.D.   On: 04/23/2015 21:08   Dg Wrist Complete Right  04/23/2015  CLINICAL DATA:  29 year old with a crush injury to the right forearm and wrist 3 days ago, anterior pain with edema and erythema. Initial encounter. EXAM: RIGHT WRIST - COMPLETE 3+ VIEW COMPARISON:  None. FINDINGS: Volar and medial soft tissue swelling. No evidence of acute fracture or dislocation. Joint spaces well preserved. Well-preserved bone mineral density. No intrinsic osseous abnormalities. IMPRESSION: No osseous abnormality. Electronically Signed   By: Hulan Saas M.D.   On: 04/23/2015 21:07     CBC  Recent Labs Lab 04/23/15 2100 04/24/15 0341 04/25/15 0444 04/26/15 0229  WBC 11.8* 9.3 11.0* 6.7  HGB 14.9 12.1 13.1 14.1  HCT 43.2 34.8* 38.5 42.6  PLT 223 182 171 225  MCV 91.3 91.8 93.4 93.6  MCH 31.5 31.9 31.8 31.0  MCHC 34.5 34.8 34.0 33.1  RDW 11.8 12.3 12.5 12.5  LYMPHSABS 1.7 2.4  --   --   MONOABS 0.6 0.4  --    --   EOSABS 0.1 0.2  --   --   BASOSABS 0.0 0.0  --   --     Chemistries   Recent Labs Lab 04/23/15 2100 04/24/15 0341 04/25/15 0444 04/26/15 0229  NA 126* 133* 134* 138  K 4.7 4.0 4.0 3.9  CL 91* 103 100* 102  CO2 GLUCOSE 735* 363* 201* 203*  BUN 11 7 <5* 7  CREATININE 0.79 0.60 0.46 0.50  CALCIUM 9.0 8.0* 8.3* 9.0  MG  --   --  1.5*  --    ------------------------------------------------------------------------------------------------------------------ estimated creatinine clearance is 85 mL/min (by C-G formula based on Cr of 0.5). ------------------------------------------------------------------------------------------------------------------  Recent Labs  04/24/15 0341  HGBA1C 11.9*   ------------------------------------------------------------------------------------------------------------------ No results for input(s): CHOL, HDL, LDLCALC, TRIG, CHOLHDL, LDLDIRECT in the last 72 hours. ------------------------------------------------------------------------------------------------------------------ No results for input(s): TSH, T4TOTAL, T3FREE, THYROIDAB in the last 72 hours.  Invalid input(s): FREET3 ------------------------------------------------------------------------------------------------------------------ No results for input(s): VITAMINB12, FOLATE, FERRITIN, TIBC, IRON, RETICCTPCT in the last 72 hours.  Coagulation profile No results for input(s): INR, PROTIME in the last 168 hours.  No results for input(s): DDIMER in the last 72 hours.  Cardiac Enzymes No results for input(s): CKMB, TROPONINI, MYOGLOBIN in the last 168 hours.  Invalid input(s): CK ------------------------------------------------------------------------------------------------------------------ Invalid input(s): POCBNP   Time Spent in minutes  25 minutes   Galya Dunnigan M.D on 04/26/2015 at 2:51 PM  Between 7am to 7pm - Pager - [phone]  After 7pm go  to [email or web address] - password [facility]  [facility] -  Office  [phone]

## 2015-04-26 NOTE — Progress Notes (Signed)
Pt CBG running high in 300's. Dr. Randol KernElgergawy is aware and extra 10 units of lantus ordered for Pt.

## 2015-04-26 NOTE — Progress Notes (Signed)
PT SEEN/EXAMINED RIGHT FOREARM LOOKS MUCH BETTER DRESSING CHANGED NEW DRESSING APPLIED NO WORSENING INFECTION, NO PURULENCE OK TO GO HOME  KEEP SPLINT ON AT ALL TIMES F/U WITH ME IN ONE WEEK D/C INSTRUCTIONS COMPLETED

## 2015-04-27 LAB — GLUCOSE, CAPILLARY
GLUCOSE-CAPILLARY: 351 mg/dL — AB (ref 65–99)
Glucose-Capillary: 233 mg/dL — ABNORMAL HIGH (ref 65–99)

## 2015-04-27 LAB — CBC
HCT: 40.3 % (ref 36.0–46.0)
HEMOGLOBIN: 13.6 g/dL (ref 12.0–15.0)
MCH: 31.7 pg (ref 26.0–34.0)
MCHC: 33.7 g/dL (ref 30.0–36.0)
MCV: 93.9 fL (ref 78.0–100.0)
Platelets: 249 10*3/uL (ref 150–400)
RBC: 4.29 MIL/uL (ref 3.87–5.11)
RDW: 12.6 % (ref 11.5–15.5)
WBC: 6.3 10*3/uL (ref 4.0–10.5)

## 2015-04-27 LAB — BASIC METABOLIC PANEL
ANION GAP: 8 (ref 5–15)
BUN: 14 mg/dL (ref 6–20)
CHLORIDE: 100 mmol/L — AB (ref 101–111)
CO2: 26 mmol/L (ref 22–32)
Calcium: 9.1 mg/dL (ref 8.9–10.3)
Creatinine, Ser: 0.64 mg/dL (ref 0.44–1.00)
GFR calc Af Amer: >60 mL/min (ref >60)
GLUCOSE: 360 mg/dL — AB (ref 65–99)
POTASSIUM: 4.8 mmol/L (ref 3.5–5.1)
SODIUM: 134 mmol/L — AB (ref 135–145)

## 2015-04-27 LAB — CULTURE, ROUTINE-ABSCESS

## 2015-04-27 MED ORDER — NICOTINE 21 MG/24HR TD PT24: 21.0000 mg | MEDICATED_PATCH | Freq: Every day | TRANSDERMAL | Status: AC

## 2015-04-27 MED ORDER — CEPHALEXIN 500 MG PO CAPS: 500.0000 mg | ORAL_CAPSULE | Freq: Four times a day (QID) | ORAL | Status: AC

## 2015-04-27 MED ORDER — INSULIN GLARGINE 100 UNIT/ML ~~LOC~~ SOLN
45.0000 [IU] | Freq: Every day | SUBCUTANEOUS | Status: DC
  Administered 2015-04-27: 45 [IU] via SUBCUTANEOUS
  Filled 2015-04-27: qty 0.45

## 2015-04-27 MED ORDER — INSULIN GLARGINE 100 UNIT/ML ~~LOC~~ SOLN: 45.0000 [IU] | Freq: Every day | SUBCUTANEOUS | Status: DC

## 2015-04-27 MED ORDER — INSULIN ASPART 100 UNIT/ML ~~LOC~~ SOLN: 8.0000 [IU] | Freq: Three times a day (TID) | SUBCUTANEOUS | Status: AC

## 2015-04-27 MED ORDER — HYDROCODONE-ACETAMINOPHEN 5-325 MG PO TABS: 1.0000 | ORAL_TABLET | Freq: Four times a day (QID) | ORAL | Status: AC | PRN

## 2015-04-27 MED ORDER — DIPHENHYDRAMINE HCL 25 MG PO CAPS
25.0000 mg | ORAL_CAPSULE | Freq: Once | ORAL | Status: AC
  Administered 2015-04-27: 25 mg via ORAL
  Filled 2015-04-27: qty 1

## 2015-04-27 MED ORDER — INSULIN GLARGINE 100 UNIT/ML ~~LOC~~ SOLN: 45.0000 [IU] | Freq: Every day | SUBCUTANEOUS | Status: AC

## 2015-04-27 NOTE — Progress Notes (Signed)
Pt requested medicine to help her sleep. MD paged and Benadryl was ordered. Medication was administered as ordered.

## 2015-04-27 NOTE — Progress Notes (Addendum)
CM spoke to patient at the bedside. Patient states that she MIGHT have enough insulin at discharge to last until she gets back to [place] and has refills. CM advised that her medicaid will not work in [place] and she verbalized understanding and said if she had to get refills in [place], she could not afford it. CM put sticky note on the chart to advise for new RX to be given to patient in the event that she runs out and needs to get it filled. CM awaiting discharge RX to be entered but [facility] to ensure that she is able to get her insulin and all discharge medications filled. CM provided patient with MATCH letter and explained the program along with the cost to be $3 per medication. No further CM needs communicated and patient will be discharged home with her aunt. CM also called RN Maralyn SagoSarah to advise of match letter being given to patient and to advise of information to communicate to MD regarding new RX for insulin so it can be filled using MATCH letter.

## 2015-04-27 NOTE — Progress Notes (Signed)
Pt given discharge instructions, prescriptions, and care notes. Pt was in a hurry and did not give me time to go over discharge papers. She was trying to leave before her ex-boyfriend arrived. Pt had no further questions. Pt pulled her IV out prior to me getting into her room, no redness noted. Pt left the floor quickly via ambulation with staff, in stable condition.

## 2015-04-27 NOTE — Discharge Summary (Signed)
Barbara Webb, is a 29 y.o. female  DOB 1986/04/06  MRN [id number].  Admission date:  04/23/2015  Admitting Physician  Lorretta HarpXilin Niu, MD  Discharge Date:  04/27/2015   Primary MD  No primary care provider on file.  Recommendations for primary care physician for things to follow:  - To follow with hand surgeon Dr. Orlan Leavensrtman within 1 week for wound check.   Admission Diagnosis  arm pain Right Forearm Infection   Discharge Diagnosis  arm pain Right Forearm Infection   Principal Problem:   Cellulitis of right forearm Active Problems:   Cellulitis   Diabetes mellitus type 1, uncontrolled, without complications (HCC)   Tobacco abuse      Past Medical History  Diagnosis Date  . Cellulitis   . Diabetes mellitus (HCC)     History reviewed. No pertinent past surgical history.     History of present illness and  Hospital Course:     Kindly see H&P for history of present illness and admission details, please review complete Labs, Consult reports and Test reports for all details in brief  HPI  from the history and physical done on the day of admission on 11/22 Barbara PersonDesiree Webb is a 29 y.o. female with history of diabetes mellitus type 1 since to the ER because of worsening pain and swelling in the right forearm and wrist. Patient states that the dumpsters lid fell on her hand 3 days ago following which patient has been developing increasing pain and swelling in the forearm with subjective feeling of fever and chills. On exam patient has significant tenderness and unable to make a fist on her right side. X-rays do not show anything acute. On-call hand surgeon Dr. Alison Stallingatman was consulted and patient has been started on empiric antibiotics and admitted for cellulitis. Patient's blood sugars also found to be more 700 but not in DKA. Patient was given fluid boluses. Patient states her blood sugars have been  running high since the injury on her right arm. She does not recall her hemoglobin A1c. Denies using any IV drugs.   Hospital Course   Right forearm deep abscess - After traumatic injury, hand surgery consult greatly appreciated, patient is status post Decompression of right forearm deep abscess, distal third of the forearm by Dr Melvyn Novasrtmann on 11/22 - Abscess culture growing Staphylococcus aureus, IV Zosyn  stopped on 11/24, treated with IV vancomycin till time of discharge, abscess culture growing MSSA, will be treated for Keflex for another 12 days as an outpatient , to finish total of 14 days of antibiotic after surgery . - Right forearm looks better as per hand surgery on physical exam today, and is to continue with splint at all times, and follow with hand surgery as an outpatient.   DM type I. Poorly controlled.  - Initially on Glucose stabilizer was held giving one episode of hypoglycemia as patient was nothing by mouth. - Resumed back on insulin sliding scale,  CBGs poorly controlled, Lantus was increased to 45 units daily, continue with this  dose of discharge , continue with NovoLog sliding scale as well . - Glycohemoglobin A1c is 11.9   Dehydration. - treated with IV fluids  Smoking. - Nicotine patch   Discharge Condition:  stable  Follow UP  Follow-up Information    Follow up with [facility] On 05/16/2015.   Specialty:  Internal Medicine   Why:  9:00am. Please bring your photo ID, and arrive 10 minutes early. You may fill your Rx at the [facility] and [facility] after hospital discharge. If you cannot attend this appointment, you must call to reschedule or cancel.   Contact information:   [address] [place] [address] [phone]      Follow up with Sharma Covert, MD. Schedule an appointment as soon as possible for a visit in 7 days.   Specialty:  Orthopedic Surgery   Contact information:   [address] [place] [address] [phone]         Discharge Instructions  and  Discharge Medications         Discharge Instructions    Discharge instructions    Complete by:  As directed   Follow with hand surgeon within 7 days  Disposition Home   Diet: carbohydrate modified diet  ,  On your next visit with your primary care physician please Get Medicines reviewed and adjusted.   Please request your Prim.MD to go over all Hospital Tests and Procedure/Radiological results at the follow up, please get all Hospital records sent to your Prim MD by signing hospital release before you go home.   If you experience worsening of your admission symptoms, develop shortness of breath, life threatening emergency, suicidal or homicidal thoughts you must seek medical attention immediately by calling 911 or calling your MD immediately  if symptoms less severe.  You Must read complete instructions/literature along with all the possible adverse reactions/side effects for all the Medicines you take and that have been prescribed to you. Take any new Medicines after you have completely understood and accpet all the possible adverse reactions/side effects.   Do not drive, operating heavy machinery, perform activities at heights, swimming or participation in water activities or provide baby sitting services if your were admitted for syncope or siezures until you have seen by Primary MD or a Neurologist and advised to do so again.  Do not drive when taking Pain medications.    Do not take more than prescribed Pain, Sleep and Anxiety Medications  Special Instructions: If you have smoked or chewed Tobacco  in the last 2 yrs please stop smoking, stop any regular Alcohol  and or any Recreational drug use.  Wear Seat belts while driving.   Please note  You were cared for by a hospitalist during your hospital stay. If you have any questions about your discharge medications or the care you  received while you were in the hospital after you are discharged, you can call the unit and asked to speak with the hospitalist on call if the hospitalist that took care of you is not available. Once you are discharged, your primary care physician will handle any further medical issues. Please note that NO REFILLS for any discharge medications will be authorized once you are discharged, as it is imperative that you return to your primary care physician (or establish a relationship with a primary care physician if you do not have one) for your aftercare needs so that they can reassess your need  for medications and monitor your lab values.            Medication List    TAKE these medications        cephALEXin 500 MG capsule  Commonly known as:  KEFLEX  Take 1 capsule (500 mg total) by mouth 4 (four) times daily.     HYDROcodone-acetaminophen 5-325 MG tablet  Commonly known as:  NORCO  Take 1 tablet by mouth every 6 (six) hours as needed for moderate pain.     insulin aspart 100 UNIT/ML injection  Commonly known as:  novoLOG  Inject 8-10 Units into the skin 3 (three) times daily before meals. Sliding scale     insulin glargine 100 UNIT/ML injection  Commonly known as:  LANTUS  Inject 0.45 mLs (45 Units total) into the skin at bedtime.     nicotine 21 mg/24hr patch  Commonly known as:  NICODERM CQ - dosed in mg/24 hours  Place 1 patch (21 mg total) onto the skin daily.          Diet and Activity recommendation: See Discharge Instructions above   Consults obtained -  Hand surgery by dr Melvyn Novas   Major procedures and Radiology Reports - PLEASE review detailed and final reports for all details, in brief -   patient is status postDecompression of right forearm deep abscess, distal third of the forearm by Dr Melvyn Novas on 11/22    Dg Forearm Right  04/23/2015  CLINICAL DATA:  29 year old with a crush injury to the right forearm and wrist 3 days ago, anterior pain with edema and  erythema. Initial encounter. EXAM: RIGHT FOREARM - 2 VIEW COMPARISON:  None. FINDINGS: Volar soft tissue swelling. No evidence of acute fracture involving the radius or ulna. No intrinsic osseous abnormality. Visualized elbow joint intact. IMPRESSION: No osseous abnormality. Electronically Signed   By: Hulan Saas M.D.   On: 04/23/2015 21:08   Dg Wrist Complete Right  04/23/2015  CLINICAL DATA:  29 year old with a crush injury to the right forearm and wrist 3 days ago, anterior pain with edema and erythema. Initial encounter. EXAM: RIGHT WRIST - COMPLETE 3+ VIEW COMPARISON:  None. FINDINGS: Volar and medial soft tissue swelling. No evidence of acute fracture or dislocation. Joint spaces well preserved. Well-preserved bone mineral density. No intrinsic osseous abnormalities. IMPRESSION: No osseous abnormality. Electronically Signed   By: Hulan Saas M.D.   On: 04/23/2015 21:07    Micro Results     Recent Results (from the past 240 hour(s))  Surgical pcr screen     Status: None   Collection Time: 04/24/15  8:04 PM  Result Value Ref Range Status   MRSA, PCR NEGATIVE NEGATIVE Final   Staphylococcus aureus NEGATIVE NEGATIVE Final    Comment:        The Xpert SA Assay (FDA approved for NASAL specimens in patients over 68 years of age), is one component of a comprehensive surveillance program.  Test performance has been validated by [facility] for patients greater than or equal to 79 year old. It is not intended to diagnose infection nor to guide or monitor treatment.   Anaerobic culture     Status: None (Preliminary result)   Collection Time: 04/24/15  8:56 PM  Result Value Ref Range Status   Specimen Description ABSCESS RIGHT FOREARM  Final   Special Requests PATIENT ON FOLLOWING VANCOMYCIN ZOSYN  Final   Gram Stain   Final    FEW WBC PRESENT,BOTH PMN AND MONONUCLEAR NO SQUAMOUS  EPITHELIAL CELLS SEEN NO ORGANISMS SEEN Performed at [facility]    Culture   Final     NO ANAEROBES ISOLATED; CULTURE IN PROGRESS FOR 5 DAYS Performed at [facility]    Report Status PENDING  Incomplete  Culture, routine-abscess     Status: None   Collection Time: 04/24/15  8:56 PM  Result Value Ref Range Status   Specimen Description ABSCESS RIGHT FOREARM  Final   Special Requests PATIENT ON FOLLOWING VANCOMYCIN ZOSYN  Final   Gram Stain   Final    FEW WBC PRESENT,BOTH PMN AND MONONUCLEAR NO SQUAMOUS EPITHELIAL CELLS SEEN NO ORGANISMS SEEN Performed at [facility]    Culture   Final    MODERATE STAPHYLOCOCCUS AUREUS Note: RIFAMPIN AND GENTAMICIN SHOULD NOT BE USED AS SINGLE DRUGS FOR TREATMENT OF STAPH INFECTIONS. This organism DOES NOT demonstrate inducible Clindamycin resistance in vitro. Performed at [facility]    Report Status 04/27/2015 FINAL  Final   Organism ID, Bacteria STAPHYLOCOCCUS AUREUS  Final      Susceptibility   Staphylococcus aureus - MIC*    CLINDAMYCIN >=8 RESISTANT Resistant     ERYTHROMYCIN >=8 RESISTANT Resistant     GENTAMICIN <=0.5 SENSITIVE Sensitive     LEVOFLOXACIN 0.25 SENSITIVE Sensitive     OXACILLIN 0.5 SENSITIVE Sensitive     RIFAMPIN <=0.5 SENSITIVE Sensitive     TRIMETH/SULFA <=10 SENSITIVE Sensitive     VANCOMYCIN 1 SENSITIVE Sensitive     TETRACYCLINE <=1 SENSITIVE Sensitive     MOXIFLOXACIN <=0.25 SENSITIVE Sensitive     * MODERATE STAPHYLOCOCCUS AUREUS       Today   Subjective:   Zuleyka Kloc today has no headache,no chest abdominal pain,no new weakness tingling or numbness, feels much better wants to go home today.  Objective:   Blood pressure 100/55, pulse 78, temperature 97.6 F (36.4 C), temperature source Oral, resp. rate 14, height 5' 2.4" (1.585 m), weight 59.194 kg (130 lb 8 oz), last menstrual period 04/11/2015, SpO2 100 %.   Intake/Output Summary (Last 24 hours) at 04/27/15 1442 Last data filed at 04/27/15 1017  Gross per 24 hour  Intake 3693.75 ml  Output    1600 ml  Net 2093.75 ml    Exam Awake Alert, Oriented x 3, No new F.N deficits, Normal affect Industry.AT,PERRAL Supple Neck,No JVD, No cervical lymphadenopathy appriciated.  Symmetrical Chest wall movement, Good air movement bilaterally, CTAB RRR,No Gallops,Rubs or new Murmurs, No Parasternal Heave +ve B.Sounds, Abd Soft, Non tender, No organomegaly appriciated, No rebound -guarding or rigidity. No Cyanosis, Clubbing or edema, No new Rash or bruise, Right arm is bandaged in forearm area till mid digit, has good capillary refills..  Data Review   CBC w Diff:  Lab Results  Component Value Date   WBC 6.3 04/27/2015   HGB 13.6 04/27/2015   HCT 40.3 04/27/2015   PLT 249 04/27/2015   LYMPHOPCT 26 04/24/2015   MONOPCT 4 04/24/2015   EOSPCT 2 04/24/2015   BASOPCT 0 04/24/2015    CMP:  Lab Results  Component Value Date   NA 134* 04/27/2015   K 4.8 04/27/2015   CL 100* 04/27/2015   CO2 26 04/27/2015   BUN 14 04/27/2015   CREATININE 0.64 04/27/2015  .   Total Time in preparing paper work, data evaluation and todays exam - 35 minutes  Viaan Knippenberg M.D on 04/27/2015 at 2:42 PM  [facility]   Office  [phone]

## 2015-04-29 LAB — ANAEROBIC CULTURE

## 2015-04-30 ENCOUNTER — Encounter (HOSPITAL_COMMUNITY): Payer: Self-pay | Admitting: Orthopedic Surgery

## 2015-05-14 ENCOUNTER — Emergency Department (HOSPITAL_BASED_OUTPATIENT_CLINIC_OR_DEPARTMENT_OTHER)
Admission: EM | Admit: 2015-05-14 | Discharge: 2015-05-14 | Disposition: A | Discharge status: Home / Self Care | Payer: 59 | Attending: Emergency Medicine | Admitting: Emergency Medicine

## 2015-05-14 ENCOUNTER — Encounter (HOSPITAL_BASED_OUTPATIENT_CLINIC_OR_DEPARTMENT_OTHER): Payer: Self-pay | Admitting: Emergency Medicine

## 2015-05-14 DIAGNOSIS — F17210 Nicotine dependence, cigarettes, uncomplicated: Secondary | ICD-10-CM | POA: Insufficient documentation

## 2015-05-14 DIAGNOSIS — Z794 Long term (current) use of insulin: Secondary | ICD-10-CM | POA: Insufficient documentation

## 2015-05-14 DIAGNOSIS — Z4802 Encounter for removal of sutures: Secondary | ICD-10-CM | POA: Insufficient documentation

## 2015-05-14 DIAGNOSIS — E119 Type 2 diabetes mellitus without complications: Secondary | ICD-10-CM | POA: Insufficient documentation

## 2015-05-14 DIAGNOSIS — Z872 Personal history of diseases of the skin and subcutaneous tissue: Secondary | ICD-10-CM | POA: Insufficient documentation

## 2015-05-14 NOTE — ED Notes (Signed)
Patient was supposed to follow up with surgery but did not - she missed the appointment

## 2015-05-14 NOTE — ED Provider Notes (Signed)
CSN: [id number]     Arrival date & time 05/14/15  2056 History   First MD Initiated Contact with Patient 05/14/15 2140     Chief Complaint  Patient presents with  . Suture / Staple Removal     (Consider location/radiation/quality/duration/timing/severity/associated sxs/prior Treatment) HPI Patient presents to the emergency department for suture removal.  Patient states she has a laceration to the mid right forearm.  Patient is tender competitions from the area.  The patient states that she has had some itching around the wound but no other issues  lacerationPast Medical History  Diagnosis Date  . Cellulitis   . Diabetes mellitus [facility](HCC)    Past Surgical History  Procedure Laterality Date  . I&d extremity Right 04/24/2015    Procedure: INCISION AND DRAINAGE RIGHT FOREARM;  Surgeon: Bradly BienenstockFred Ortmann, MD;  Location: [facility] OR;  Service: Orthopedics;  Laterality: Right;   Family History  Problem Relation Age of Onset  . Diabetes Mellitus II Father    Social History  Substance Use Topics  . Smoking status: Current Every Day Smoker    Types: Cigarettes  . Smokeless tobacco: Never Used  . Alcohol Use: No   OB History    No data available     Review of Systems All other systems negative except as documented in the HPI. All pertinent positives and negatives as reviewed in the HPI.   Allergies  Review of patient's allergies indicates no known allergies.  Home Medications   Prior to Admission medications   Medication Sig Start Date End Date Taking? Authorizing Provider  HYDROcodone-acetaminophen (NORCO) 5-325 MG tablet Take 1 tablet by mouth every 6 (six) hours as needed for moderate pain. 04/27/15   Leana Roeawood S Elgergawy, MD  insulin aspart (NOVOLOG) 100 UNIT/ML injection Inject 8-10 Units into the skin 3 (three) times daily before meals. Sliding scale 04/27/15   Starleen Armsawood S Elgergawy, MD  insulin glargine (LANTUS) 100 UNIT/ML injection Inject 0.45 mLs (45 Units total) into the skin at  bedtime. 04/27/15   Leana Roeawood S Elgergawy, MD  nicotine (NICODERM CQ - DOSED IN MG/24 HOURS) 21 mg/24hr patch Place 1 patch (21 mg total) onto the skin daily. 04/27/15   Leana Roeawood S Elgergawy, MD   BP 118/87 mmHg  Pulse 106  Temp(Src) 97.6 F (36.4 C) (Oral)  Resp 18  Ht 5\' 3"  (1.6 m)  Wt 58.968 kg  BMI 23.03 kg/m2  SpO2 100%  LMP 04/11/2015 (Exact Date) Physical Exam  Musculoskeletal:       Arms: Nursing note and vitals reviewed.   ED Course  Procedures (including critical care time) Labs Review Labs Reviewed - No data to display  Imaging Review No results found. I have personally reviewed and evaluated these images and lab results as part of my medical decision-making.   EKG Interpretation None      SUTURE REMOVAL Performed by: Carlyle DollyLAWYER,Zandra Lajeunesse W  Consent: Verbal consent obtained. Patient identity confirmed: provided demographic data Time out: Immediately prior to procedure a "time out" was called to verify the correct patient, procedure, equipment, support staff and site/side marked as required.  Location details: Right mid anterior forearm  Wound Appearance: clean  Sutures/Staples Removed: 6  Patient tolerance: Patient tolerated the procedure well with no immediate complications.     Charlestine NightChristopher Lashika Erker, PA-C 05/14/15 2212  Nelva Nayobert Beaton, MD 05/14/15 [phone]

## 2015-05-14 NOTE — ED Notes (Signed)
Pa  at bedside. 

## 2015-05-14 NOTE — Discharge Instructions (Signed)
Return here as needed. Keep the area clean and dry °

## 2015-05-14 NOTE — ED Notes (Signed)
Pt states she did not follow up with surgery for suture removal

## 2015-05-14 NOTE — ED Notes (Signed)
Patient states that she has stiches in her right lower arm.

## 2015-05-16 ENCOUNTER — Ambulatory Visit: Payer: Medicaid - Out of State | Admitting: Family Medicine

## 2017-10-22 IMAGING — DX DG FOREARM 2V*R*
2 series · 2 of 2 positions shown · non-contrast
Comparison: None.

CLINICAL DATA: 29-year-old with a crush injury to the right forearm
and wrist 3 days ago, anterior pain with edema and erythema. Initial
encounter.

EXAM:
RIGHT FOREARM - 2 VIEW

[forearm ap]
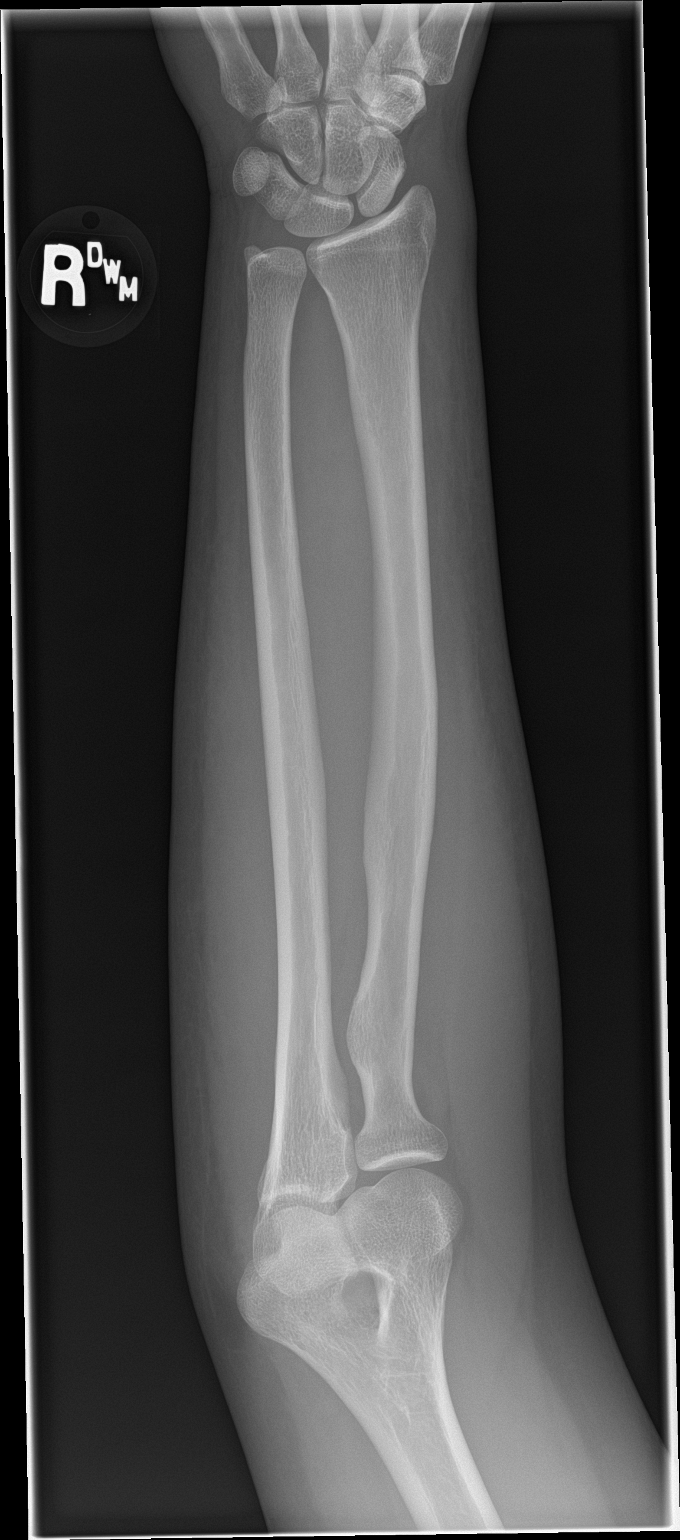

[forearm lat]
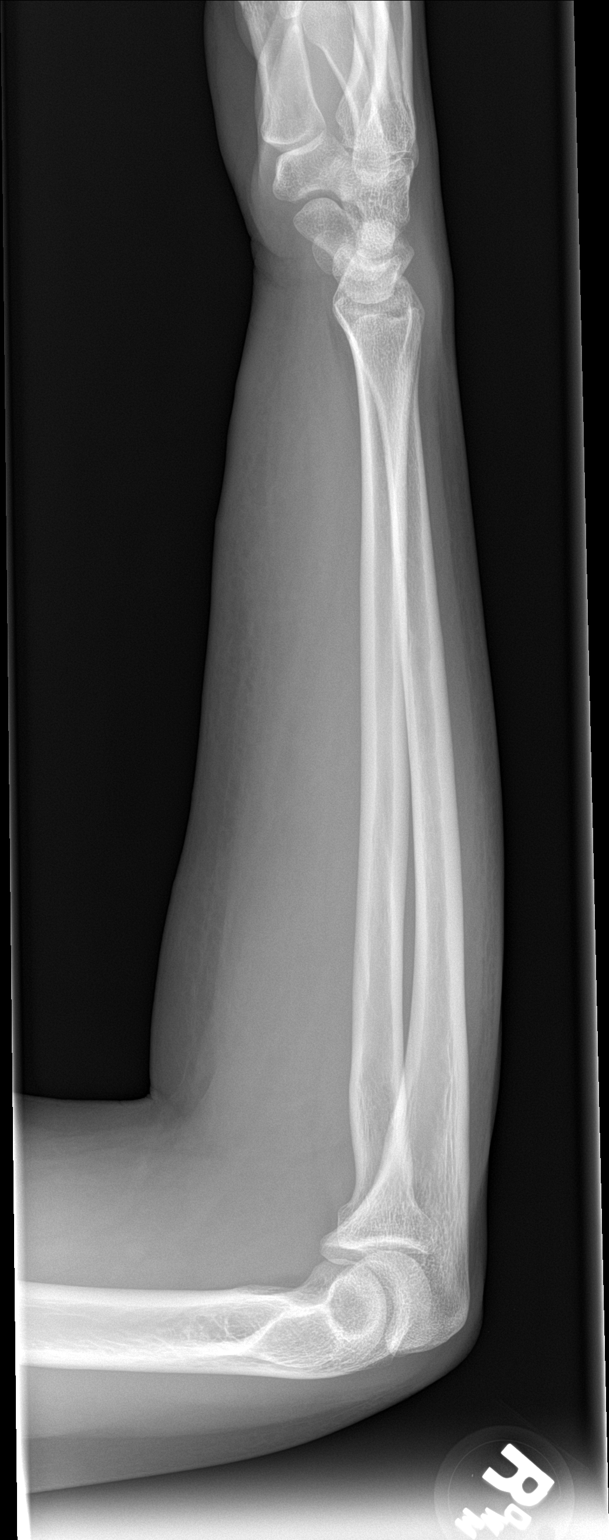

[2 of 2 positions shown; findings below may reference images not displayed]

FINDINGS: Volar soft tissue swelling. No evidence of acute fracture involving
the radius or ulna. No intrinsic osseous abnormality. Visualized
elbow joint intact.
IMPRESSION: No osseous abnormality.
